# Patient Record
Sex: Female | Born: 1955 | Race: White | Hispanic: No | Marital: Married | Smoking: Never smoker
Health system: Southern US, Community
[De-identification: ages and names within clinical notes are randomized; demographics above are authoritative.]

## PROBLEM LIST (undated history)

## (undated) DIAGNOSIS — R197 Diarrhea, unspecified: Secondary | ICD-10-CM

## (undated) DIAGNOSIS — K8020 Calculus of gallbladder without cholecystitis without obstruction: Secondary | ICD-10-CM

## (undated) DIAGNOSIS — B029 Zoster without complications: Secondary | ICD-10-CM

## (undated) DIAGNOSIS — K429 Umbilical hernia without obstruction or gangrene: Secondary | ICD-10-CM

## (undated) DIAGNOSIS — E785 Hyperlipidemia, unspecified: Secondary | ICD-10-CM

## (undated) DIAGNOSIS — F419 Anxiety disorder, unspecified: Secondary | ICD-10-CM

## (undated) DIAGNOSIS — K9189 Other postprocedural complications and disorders of digestive system: Secondary | ICD-10-CM

## (undated) DIAGNOSIS — G43909 Migraine, unspecified, not intractable, without status migrainosus: Secondary | ICD-10-CM

## (undated) DIAGNOSIS — R9431 Abnormal electrocardiogram [ECG] [EKG]: Secondary | ICD-10-CM

## (undated) DIAGNOSIS — Z9049 Acquired absence of other specified parts of digestive tract: Secondary | ICD-10-CM

## (undated) DIAGNOSIS — J302 Other seasonal allergic rhinitis: Secondary | ICD-10-CM

## (undated) HISTORY — DX: Diarrhea, unspecified: R19.7

## (undated) HISTORY — PX: CATARACT EXTRACTION: SUR2

## (undated) HISTORY — DX: Other postprocedural complications and disorders of digestive system: K91.89

## (undated) HISTORY — DX: Hyperlipidemia, unspecified: E78.5

## (undated) HISTORY — DX: Diarrhea, unspecified: Z90.49

## (undated) HISTORY — DX: Calculus of gallbladder without cholecystitis without obstruction: K80.20

## (undated) HISTORY — DX: Umbilical hernia without obstruction or gangrene: K42.9

## (undated) HISTORY — PX: HERNIA REPAIR: SHX51

## (undated) HISTORY — DX: Anxiety disorder, unspecified: F41.9

## (undated) HISTORY — PX: CHOLECYSTECTOMY: SHX55

## (undated) HISTORY — DX: Abnormal electrocardiogram (ECG) (EKG): R94.31

## (undated) HISTORY — DX: Zoster without complications: B02.9

## (undated) HISTORY — DX: Other seasonal allergic rhinitis: J30.2

---

## 2015-08-28 DIAGNOSIS — H25013 Cortical age-related cataract, bilateral: Secondary | ICD-10-CM | POA: Diagnosis not present

## 2015-08-28 DIAGNOSIS — H25043 Posterior subcapsular polar age-related cataract, bilateral: Secondary | ICD-10-CM | POA: Diagnosis not present

## 2015-08-28 DIAGNOSIS — H2513 Age-related nuclear cataract, bilateral: Secondary | ICD-10-CM | POA: Diagnosis not present

## 2015-08-28 DIAGNOSIS — H25012 Cortical age-related cataract, left eye: Secondary | ICD-10-CM | POA: Diagnosis not present

## 2015-08-31 DIAGNOSIS — H2513 Age-related nuclear cataract, bilateral: Secondary | ICD-10-CM | POA: Diagnosis not present

## 2015-08-31 DIAGNOSIS — H25012 Cortical age-related cataract, left eye: Secondary | ICD-10-CM | POA: Diagnosis not present

## 2015-08-31 DIAGNOSIS — H25013 Cortical age-related cataract, bilateral: Secondary | ICD-10-CM | POA: Diagnosis not present

## 2015-11-15 DIAGNOSIS — E785 Hyperlipidemia, unspecified: Secondary | ICD-10-CM | POA: Insufficient documentation

## 2015-11-15 DIAGNOSIS — E663 Overweight: Secondary | ICD-10-CM | POA: Insufficient documentation

## 2015-11-15 DIAGNOSIS — E559 Vitamin D deficiency, unspecified: Secondary | ICD-10-CM | POA: Insufficient documentation

## 2015-11-15 HISTORY — DX: Vitamin D deficiency, unspecified: E55.9

## 2015-11-15 HISTORY — DX: Overweight: E66.3

## 2016-03-17 DIAGNOSIS — H04123 Dry eye syndrome of bilateral lacrimal glands: Secondary | ICD-10-CM | POA: Diagnosis not present

## 2016-04-02 DIAGNOSIS — B86 Scabies: Secondary | ICD-10-CM | POA: Diagnosis not present

## 2016-04-21 DIAGNOSIS — H538 Other visual disturbances: Secondary | ICD-10-CM | POA: Diagnosis not present

## 2016-10-13 DIAGNOSIS — R109 Unspecified abdominal pain: Secondary | ICD-10-CM | POA: Diagnosis not present

## 2016-10-13 DIAGNOSIS — R1032 Left lower quadrant pain: Secondary | ICD-10-CM | POA: Diagnosis not present

## 2016-10-13 DIAGNOSIS — R1030 Lower abdominal pain, unspecified: Secondary | ICD-10-CM | POA: Diagnosis not present

## 2016-11-27 DIAGNOSIS — H04123 Dry eye syndrome of bilateral lacrimal glands: Secondary | ICD-10-CM | POA: Diagnosis not present

## 2016-12-30 DIAGNOSIS — D485 Neoplasm of uncertain behavior of skin: Secondary | ICD-10-CM | POA: Diagnosis not present

## 2016-12-30 DIAGNOSIS — D2239 Melanocytic nevi of other parts of face: Secondary | ICD-10-CM | POA: Diagnosis not present

## 2016-12-30 DIAGNOSIS — D1801 Hemangioma of skin and subcutaneous tissue: Secondary | ICD-10-CM | POA: Diagnosis not present

## 2016-12-30 DIAGNOSIS — L821 Other seborrheic keratosis: Secondary | ICD-10-CM | POA: Diagnosis not present

## 2016-12-30 DIAGNOSIS — D225 Melanocytic nevi of trunk: Secondary | ICD-10-CM | POA: Diagnosis not present

## 2017-01-01 DIAGNOSIS — K4090 Unilateral inguinal hernia, without obstruction or gangrene, not specified as recurrent: Secondary | ICD-10-CM | POA: Diagnosis not present

## 2017-01-01 DIAGNOSIS — G43909 Migraine, unspecified, not intractable, without status migrainosus: Secondary | ICD-10-CM | POA: Diagnosis not present

## 2017-01-07 DIAGNOSIS — K429 Umbilical hernia without obstruction or gangrene: Secondary | ICD-10-CM

## 2017-01-07 DIAGNOSIS — G43909 Migraine, unspecified, not intractable, without status migrainosus: Secondary | ICD-10-CM | POA: Insufficient documentation

## 2017-01-07 DIAGNOSIS — M5030 Other cervical disc degeneration, unspecified cervical region: Secondary | ICD-10-CM | POA: Insufficient documentation

## 2017-01-07 DIAGNOSIS — N3281 Overactive bladder: Secondary | ICD-10-CM

## 2017-01-07 HISTORY — DX: Overactive bladder: N32.81

## 2017-01-07 HISTORY — DX: Umbilical hernia without obstruction or gangrene: K42.9

## 2017-01-07 HISTORY — DX: Other cervical disc degeneration, unspecified cervical region: M50.30

## 2017-01-10 DIAGNOSIS — K429 Umbilical hernia without obstruction or gangrene: Secondary | ICD-10-CM | POA: Diagnosis not present

## 2017-01-10 DIAGNOSIS — R1013 Epigastric pain: Secondary | ICD-10-CM | POA: Diagnosis not present

## 2017-01-16 DIAGNOSIS — Z79899 Other long term (current) drug therapy: Secondary | ICD-10-CM | POA: Diagnosis not present

## 2017-01-16 DIAGNOSIS — K429 Umbilical hernia without obstruction or gangrene: Secondary | ICD-10-CM | POA: Diagnosis not present

## 2017-01-16 DIAGNOSIS — G43909 Migraine, unspecified, not intractable, without status migrainosus: Secondary | ICD-10-CM | POA: Diagnosis not present

## 2017-02-23 DIAGNOSIS — Z01419 Encounter for gynecological examination (general) (routine) without abnormal findings: Secondary | ICD-10-CM | POA: Diagnosis not present

## 2017-02-23 DIAGNOSIS — Z1231 Encounter for screening mammogram for malignant neoplasm of breast: Secondary | ICD-10-CM | POA: Diagnosis not present

## 2017-03-24 DIAGNOSIS — L820 Inflamed seborrheic keratosis: Secondary | ICD-10-CM | POA: Diagnosis not present

## 2017-04-13 DIAGNOSIS — Z1231 Encounter for screening mammogram for malignant neoplasm of breast: Secondary | ICD-10-CM | POA: Diagnosis not present

## 2017-05-07 DIAGNOSIS — L820 Inflamed seborrheic keratosis: Secondary | ICD-10-CM | POA: Diagnosis not present

## 2017-09-07 DIAGNOSIS — J019 Acute sinusitis, unspecified: Secondary | ICD-10-CM | POA: Diagnosis not present

## 2017-09-07 DIAGNOSIS — G43909 Migraine, unspecified, not intractable, without status migrainosus: Secondary | ICD-10-CM | POA: Diagnosis not present

## 2017-09-17 DIAGNOSIS — L820 Inflamed seborrheic keratosis: Secondary | ICD-10-CM | POA: Diagnosis not present

## 2017-09-25 DIAGNOSIS — J019 Acute sinusitis, unspecified: Secondary | ICD-10-CM | POA: Diagnosis not present

## 2017-10-02 DIAGNOSIS — J0101 Acute recurrent maxillary sinusitis: Secondary | ICD-10-CM | POA: Diagnosis not present

## 2017-11-30 DIAGNOSIS — H04123 Dry eye syndrome of bilateral lacrimal glands: Secondary | ICD-10-CM | POA: Diagnosis not present

## 2018-01-07 DIAGNOSIS — D1801 Hemangioma of skin and subcutaneous tissue: Secondary | ICD-10-CM | POA: Diagnosis not present

## 2018-01-07 DIAGNOSIS — D2239 Melanocytic nevi of other parts of face: Secondary | ICD-10-CM | POA: Diagnosis not present

## 2018-01-07 DIAGNOSIS — D225 Melanocytic nevi of trunk: Secondary | ICD-10-CM | POA: Diagnosis not present

## 2018-01-07 DIAGNOSIS — D485 Neoplasm of uncertain behavior of skin: Secondary | ICD-10-CM | POA: Diagnosis not present

## 2018-01-07 DIAGNOSIS — L821 Other seborrheic keratosis: Secondary | ICD-10-CM | POA: Diagnosis not present

## 2018-04-21 DIAGNOSIS — Z6825 Body mass index (BMI) 25.0-25.9, adult: Secondary | ICD-10-CM | POA: Diagnosis not present

## 2018-04-21 DIAGNOSIS — J0100 Acute maxillary sinusitis, unspecified: Secondary | ICD-10-CM | POA: Diagnosis not present

## 2018-05-24 DIAGNOSIS — S058X2A Other injuries of left eye and orbit, initial encounter: Secondary | ICD-10-CM | POA: Diagnosis not present

## 2018-06-24 DIAGNOSIS — H43813 Vitreous degeneration, bilateral: Secondary | ICD-10-CM | POA: Diagnosis not present

## 2018-07-01 ENCOUNTER — Other Ambulatory Visit: Payer: Self-pay | Admitting: Gastroenterology

## 2018-07-01 DIAGNOSIS — R1011 Right upper quadrant pain: Secondary | ICD-10-CM | POA: Diagnosis not present

## 2018-07-05 ENCOUNTER — Inpatient Hospital Stay: Admission: RE | Admit: 2018-07-05 | Payer: Self-pay | Source: Ambulatory Visit

## 2018-07-05 DIAGNOSIS — K8020 Calculus of gallbladder without cholecystitis without obstruction: Secondary | ICD-10-CM | POA: Diagnosis not present

## 2018-07-05 DIAGNOSIS — K8080 Other cholelithiasis without obstruction: Secondary | ICD-10-CM | POA: Diagnosis not present

## 2018-07-05 DIAGNOSIS — R1011 Right upper quadrant pain: Secondary | ICD-10-CM | POA: Diagnosis not present

## 2018-07-09 ENCOUNTER — Emergency Department (HOSPITAL_COMMUNITY): Payer: BLUE CROSS/BLUE SHIELD

## 2018-07-09 ENCOUNTER — Encounter: Payer: Self-pay | Admitting: Emergency Medicine

## 2018-07-09 ENCOUNTER — Emergency Department (HOSPITAL_COMMUNITY)
Admission: EM | Admit: 2018-07-09 | Discharge: 2018-07-09 | Disposition: A | Discharge status: Home / Self Care | Payer: BLUE CROSS/BLUE SHIELD | Attending: Emergency Medicine | Admitting: Emergency Medicine

## 2018-07-09 DIAGNOSIS — R1013 Epigastric pain: Secondary | ICD-10-CM | POA: Diagnosis not present

## 2018-07-09 DIAGNOSIS — K8020 Calculus of gallbladder without cholecystitis without obstruction: Secondary | ICD-10-CM | POA: Diagnosis not present

## 2018-07-09 HISTORY — DX: Migraine, unspecified, not intractable, without status migrainosus: G43.909

## 2018-07-09 LAB — COMPREHENSIVE METABOLIC PANEL
ALT: 22 U/L (ref 0–44)
AST: 29 U/L (ref 15–41)
Albumin: 4.2 g/dL (ref 3.5–5.0)
Alkaline Phosphatase: 52 U/L (ref 38–126)
Anion gap: 12 (ref 5–15)
BUN: 15 mg/dL (ref 8–23)
CO2: 23 mmol/L (ref 22–32)
Calcium: 9.5 mg/dL (ref 8.9–10.3)
Chloride: 102 mmol/L (ref 98–111)
Creatinine, Ser: 0.86 mg/dL (ref 0.44–1.00)
GFR calc Af Amer: >60 mL/min (ref >60)
GFR calc non Af Amer: >60 mL/min (ref >60)
Glucose, Bld: 91 mg/dL (ref 70–99)
Potassium: 3.6 mmol/L (ref 3.5–5.1)
Sodium: 137 mmol/L (ref 135–145)
Total Bilirubin: 1 mg/dL (ref 0.3–1.2)
Total Protein: 6.9 g/dL (ref 6.5–8.1)

## 2018-07-09 LAB — URINALYSIS, ROUTINE W REFLEX MICROSCOPIC
Bilirubin Urine: NEGATIVE
GLUCOSE, UA: NEGATIVE mg/dL
Hgb urine dipstick: NEGATIVE
Ketones, ur: 80 mg/dL — AB
Leukocytes,Ua: NEGATIVE
Nitrite: NEGATIVE
Protein, ur: NEGATIVE mg/dL
Specific Gravity, Urine: 1.026 (ref 1.005–1.030)
pH: 5 (ref 5.0–8.0)

## 2018-07-09 LAB — CBC
HCT: 43.4 % (ref 36.0–46.0)
Hemoglobin: 14.9 g/dL (ref 12.0–15.0)
MCH: 29.9 pg (ref 26.0–34.0)
MCHC: 34.3 g/dL (ref 30.0–36.0)
MCV: 87.1 fL (ref 80.0–100.0)
PLATELETS: 244 10*3/uL (ref 150–400)
RBC: 4.98 MIL/uL (ref 3.87–5.11)
RDW: 12.8 % (ref 11.5–15.5)
WBC: 5.1 10*3/uL (ref 4.0–10.5)
nRBC: 0 % (ref 0.0–0.2)

## 2018-07-09 LAB — LIPASE, BLOOD: Lipase: 51 U/L (ref 11–51)

## 2018-07-09 MED ORDER — IOHEXOL 300 MG/ML  SOLN
100.0000 mL | Freq: Once | INTRAMUSCULAR | Status: AC | PRN
  Administered 2018-07-09: 100 mL via INTRAVENOUS

## 2018-07-09 MED ORDER — OMEPRAZOLE 20 MG PO CPDR: 20.0000 mg | DELAYED_RELEASE_CAPSULE | Freq: Two times a day (BID) | ORAL | 0 refills | Status: DC

## 2018-07-09 MED ORDER — ONDANSETRON HCL 4 MG/2ML IJ SOLN
4.0000 mg | Freq: Once | INTRAMUSCULAR | Status: DC
  Filled 2018-07-09: qty 2

## 2018-07-09 MED ORDER — SODIUM CHLORIDE 0.9% FLUSH
3.0000 mL | Freq: Once | INTRAVENOUS | Status: AC
  Administered 2018-07-09: 3 mL via INTRAVENOUS

## 2018-07-09 MED ORDER — MORPHINE SULFATE (PF) 4 MG/ML IV SOLN
4.0000 mg | Freq: Once | INTRAVENOUS | Status: DC
  Filled 2018-07-09: qty 1

## 2018-07-09 MED ORDER — SODIUM CHLORIDE 0.9 % IV BOLUS
1000.0000 mL | Freq: Once | INTRAVENOUS | Status: AC
  Administered 2018-07-09: 1000 mL via INTRAVENOUS

## 2018-07-09 NOTE — ED Notes (Signed)
Patient transported to Ultrasound 

## 2018-07-09 NOTE — ED Provider Notes (Signed)
[facility] EMERGENCY DEPARTMENT Provider Note   CSN: [id number] Arrival date & time: 07/09/18  1441    History   Chief Complaint Chief Complaint  Patient presents with  . Abdominal Pain    HPI Ashley Harper is a 63 y.o. female.     The history is provided by the patient and medical records. No language interpreter was used.  Abdominal Pain     63 year old female with history of cholelithiasis presenting complaining of abdominal pain.  Patient report for the past month she has had recurrent upper abdominal pain.  Pain is postprandial, described as a crampy sensation across the abdomen radiates to her back and her shoulder.  Eating makes the pain worse.  She was seen by GI specialist and had an ultrasound of her gallbladder 5 days ago show evidence of cholelithiasis.  She reported having elevated lipase of 87 and was told that she has pancreatitis.  Her pain still persists, she is eating less, and currently losing weight from not eating.  She does not complain of any fever or chills, no chest pain shortness of breath productive cough dysuria bowel bladder changes.  She did call surgery today in regards to her pain and was told to come to the ER for further evaluation.  She rates the pain is moderate in severity at this time.  She denies history of diabetes, or alcohol abuse.  Past Medical History:  Diagnosis Date  . Migraine     There are no active problems to display for this patient.   Past Surgical History:  Procedure Laterality Date  . HERNIA REPAIR       OB History   No obstetric history on file.      Home Medications    Prior to Admission medications   Not on File    Family History No family history on file.  Social History Social History   Tobacco Use  . Smoking status: Not on file  Substance Use Topics  . Alcohol use: Not on file  . Drug use: Not on file     Allergies   Patient has no known allergies.   Review of  Systems Review of Systems  Gastrointestinal: Positive for abdominal pain.  All other systems reviewed and are negative.    Physical Exam Updated Vital Signs BP (!) 156/80 (BP Location: Right Arm)   Pulse 86   Temp 98.8 F (37.1 C) (Oral)   Resp 16   SpO2 98%   Physical Exam Vitals signs and nursing note reviewed.  Constitutional:      General: She is not in acute distress.    Appearance: She is well-developed.  HENT:     Head: Atraumatic.  Eyes:     Conjunctiva/sclera: Conjunctivae normal.  Neck:     Musculoskeletal: Neck supple.  Cardiovascular:     Rate and Rhythm: Normal rate and regular rhythm.  Pulmonary:     Effort: Pulmonary effort is normal.     Breath sounds: Normal breath sounds.  Abdominal:     General: Abdomen is flat.     Palpations: Abdomen is soft.     Tenderness: There is abdominal tenderness in the right upper quadrant, epigastric area and left upper quadrant. Positive signs include Murphy's sign. Negative signs include McBurney's sign.     Hernia: No hernia is present.  Skin:    Findings: No rash.  Neurological:     Mental Status: She is alert.      ED Treatments /  Results  Labs (all labs ordered are listed, but only abnormal results are displayed) Labs Reviewed  URINALYSIS, ROUTINE W REFLEX MICROSCOPIC - Abnormal; Notable for the following components:      Result Value   Ketones, ur 80 (*)    All other components within normal limits  LIPASE, BLOOD  COMPREHENSIVE METABOLIC PANEL  CBC    EKG None  Radiology Ct Abdomen Pelvis W Contrast  Result Date: 07/09/2018 CLINICAL DATA:  63 y/o F; 1 month of right upper quadrant abdominal pain. EXAM: CT ABDOMEN AND PELVIS WITH CONTRAST TECHNIQUE: Multidetector CT imaging of the abdomen and pelvis was performed using the standard protocol following bolus administration of intravenous contrast. CONTRAST:  OMNIPAQUE IOHEXOL 300 MG/ML  SOLN COMPARISON:  07/09/2018 abdominal ultrasound. FINDINGS:  Lower chest: No acute abnormality. Hepatobiliary: No focal liver abnormality is seen. Radiolucent cholelithiasis. No gallbladder wall thickening or biliary ductal dilatation. Pancreas: Unremarkable. No pancreatic ductal dilatation or surrounding inflammatory changes. Spleen: Normal in size without focal abnormality. Adrenals/Urinary Tract: Adrenal glands are unremarkable. Right kidney upper and lower pole cyst measuring up to 11 mm. Otherwise kidneys are normal, without renal calculi, focal lesion, or hydronephrosis. Bladder is unremarkable. Stomach/Bowel: Stomach is within normal limits. Appendix appears normal. No evidence of bowel wall thickening, distention, or inflammatory changes. Vascular/Lymphatic: Aortic atherosclerosis. No enlarged abdominal or pelvic lymph nodes. Reproductive: Uterus and bilateral adnexa are unremarkable. Other: No abdominal wall hernia or abnormality. No abdominopelvic ascites. Musculoskeletal: No fracture is seen. IMPRESSION: 1. No acute process identified. 2. Cholelithiasis. Aortic Atherosclerosis (ICD10-I70.0). Electronically Signed   By: Mitzi Hansen M.D.   On: 07/09/2018 21:54   US Abdomen Limited  Result Date: 07/09/2018 CLINICAL DATA:  Right upper quadrant pain EXAM: ULTRASOUND ABDOMEN LIMITED RIGHT UPPER QUADRANT COMPARISON:  None. FINDINGS: Gallbladder: 1.5 cm gallstone, mobile. No wall thickening or sonographic Murphy's sign. Common bile duct: Diameter: Normal caliber, 4 mm Liver: No focal lesion identified. Within normal limits in parenchymal echogenicity. Portal vein is patent on color Doppler imaging with normal direction of blood flow towards the liver. IMPRESSION: Cholelithiasis.  No sonographic evidence of acute cholecystitis. Electronically Signed   By: Charlett Nose M.D.   On: 07/09/2018 20:06    Procedures Procedures (including critical care time)  Medications Ordered in ED Medications  morphine 4 MG/ML injection 4 mg (4 mg Intravenous Refused  07/09/18 1912)  ondansetron (ZOFRAN) injection 4 mg (4 mg Intravenous Refused 07/09/18 1912)  sodium chloride flush (NS) 0.9 % injection 3 mL (3 mLs Intravenous Given 07/09/18 2033)  sodium chloride 0.9 % bolus 1,000 mL (1,000 mLs Intravenous New Bag/Given 07/09/18 2032)  iohexol (OMNIPAQUE) 300 MG/ML solution 100 mL (100 mLs Intravenous Contrast Given 07/09/18 2124)     Initial Impression / Assessment and Plan / ED Course  I have reviewed the triage vital signs and the nursing notes.  Pertinent labs & imaging results that were available during my care of the patient were reviewed by me and considered in my medical decision making (see chart for details).        BP 138/87   Pulse 76   Temp 98.8 F (37.1 C) (Oral)   Resp 16   SpO2 98%    Final Clinical Impressions(s) / ED Diagnoses   Final diagnoses:  Epigastric pain    ED Discharge Orders         Ordered    omeprazole (PRILOSEC) 20 MG capsule  2 times daily before meals     07/09/18  2224         6:29 PM Patient diagnosed with cholelithiasis 5 days ago here with recurrent upper abdominal pain.  She brought with her the ultrasound results showing a 1.7 cm gallstone without any signs of cholecystitis or dilatation of the common bile duct.  She does have tenderness on her upper abdomen during examination.  She would benefit from a repeat abdominal ultrasound.  Initially her labs remarkable for 80 ketones in urine, IV fluid given along with pain medication and antinausea medication.  She has normal lipase, normal electrolytes panel, normal H&H and normal WBC.  8:14 PM Repeat abdominal ultrasound demonstrating 1.5 cm gallstone that is mobile but no sonographic evidence of acute cholecystitis.  CBD within normal caliber.  Patient still voiced concern about her upper abdominal pain and would like to have a CT scan for further evaluation.  States that she is going on vacation tomorrow and would like to make sure she does not have any  acute life-threatening problem.  We will obtain abdominal pelvis CT scan for further care.  Care discussed with Dr. Charm Barges.  10:16 PM Abd/pelvis CT without acute changes.  Pt reassured.  Recommend eating bland food and f/u with GI specialist for further care.  prilosec prescribed.  Return precaution given.    Fayrene Helper, PA-C 07/09/18 2225    Terrilee Files, MD 07/10/18 [phone]

## 2018-07-09 NOTE — ED Triage Notes (Addendum)
Pt arrives to ED with upper abd pain that radiates into her back and shoulder- Pt had [place] that showed a gallstone and was told her lipase was evaluated. Pt here for a possible surgical consult.  Pt reports she has had a decreased appetite and even drinking water causing increase in pain.

## 2018-08-12 DIAGNOSIS — J0100 Acute maxillary sinusitis, unspecified: Secondary | ICD-10-CM | POA: Diagnosis not present

## 2018-10-12 DIAGNOSIS — Z9109 Other allergy status, other than to drugs and biological substances: Secondary | ICD-10-CM | POA: Diagnosis not present

## 2018-10-12 DIAGNOSIS — H6981 Other specified disorders of Eustachian tube, right ear: Secondary | ICD-10-CM | POA: Diagnosis not present

## 2018-10-12 DIAGNOSIS — Z6824 Body mass index (BMI) 24.0-24.9, adult: Secondary | ICD-10-CM | POA: Diagnosis not present

## 2019-02-02 DIAGNOSIS — Z01419 Encounter for gynecological examination (general) (routine) without abnormal findings: Secondary | ICD-10-CM | POA: Diagnosis not present

## 2019-02-02 DIAGNOSIS — Z1239 Encounter for other screening for malignant neoplasm of breast: Secondary | ICD-10-CM | POA: Diagnosis not present

## 2019-02-07 DIAGNOSIS — H04123 Dry eye syndrome of bilateral lacrimal glands: Secondary | ICD-10-CM | POA: Diagnosis not present

## 2019-03-07 IMAGING — CT CT ABD-PELV W/ CM
2 of 5 series · 16 of 46 positions shown, 18 images · IV contrast (Omni 300)
Comparison: 07/09/2018 abdominal ultrasound.

CLINICAL DATA: 62 y/o F; 1 month of right upper quadrant abdominal
pain.

EXAM:
CT ABDOMEN AND PELVIS WITH CONTRAST
TECHNIQUE: Multidetector CT imaging of the abdomen and pelvis was performed
using the standard protocol following bolus administration of
intravenous contrast.
CONTRAST:  100mL OMNIPAQUE IOHEXOL 300 MG/ML  SOLN

[Series 3: a/p w/ 5mm · axial · 0.74mm/px · z∈[-436,-36]mm · 13 of 90 slices shown, 15 images]
[im 5/90  soft-tissue]
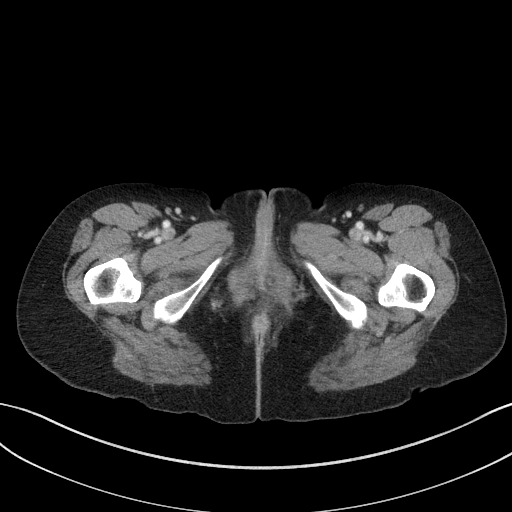
[im 5/90  bone]
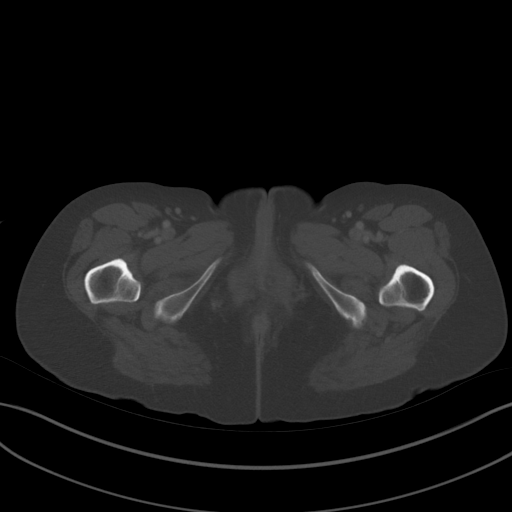
[im 10/90  soft-tissue]
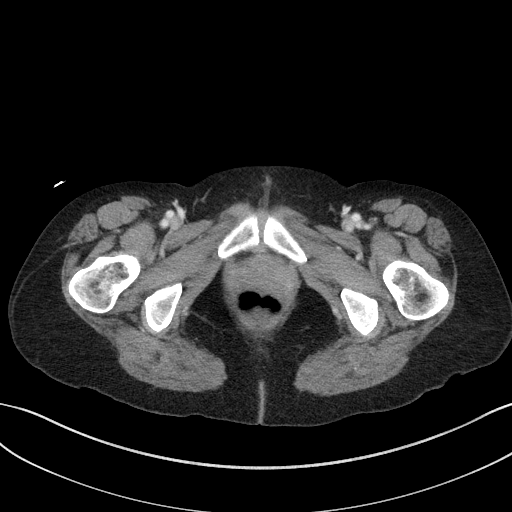
[im 20/90  soft-tissue]
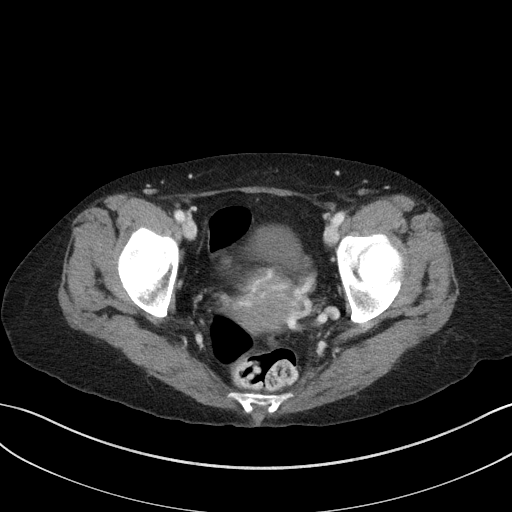
[im 25/90  soft-tissue]
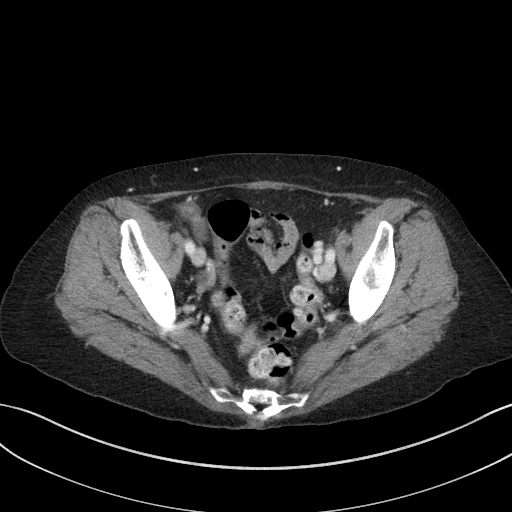
[im 30/90  soft-tissue]
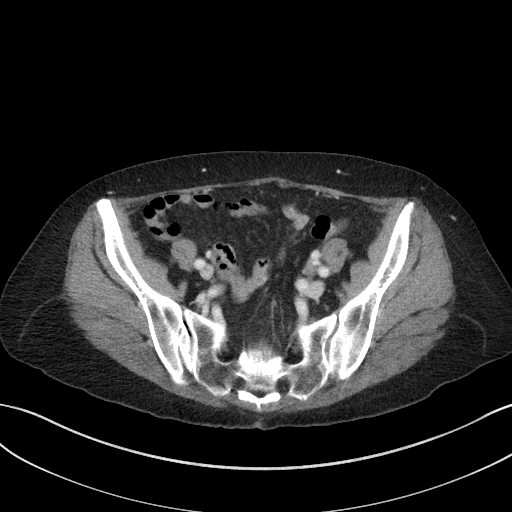
[im 40/90  soft-tissue]
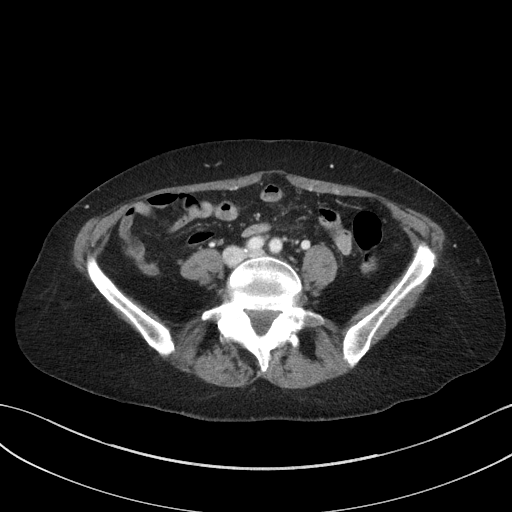
[im 45/90  soft-tissue]
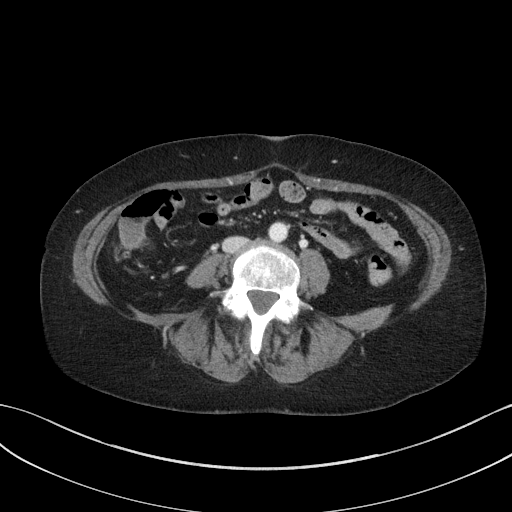
[im 50/90  soft-tissue]
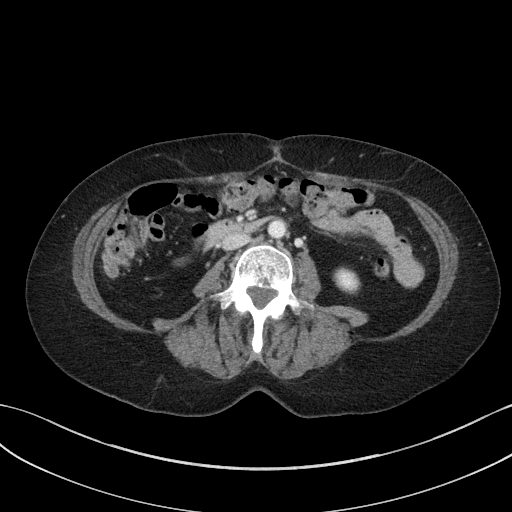
[im 60/90  soft-tissue]
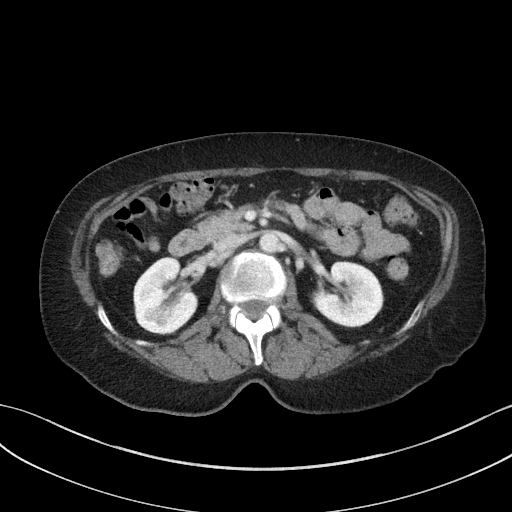
[im 60/90  bone]
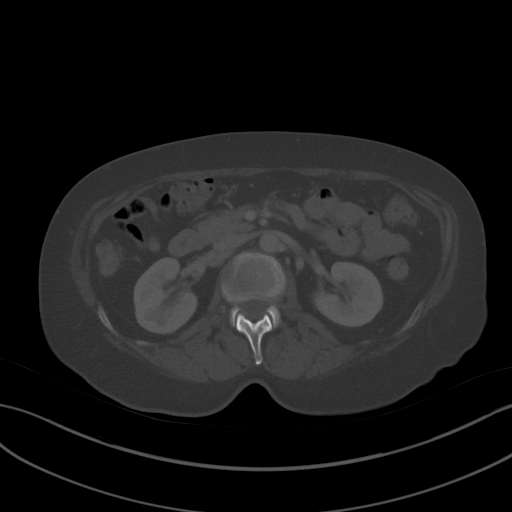
[im 65/90  soft-tissue]
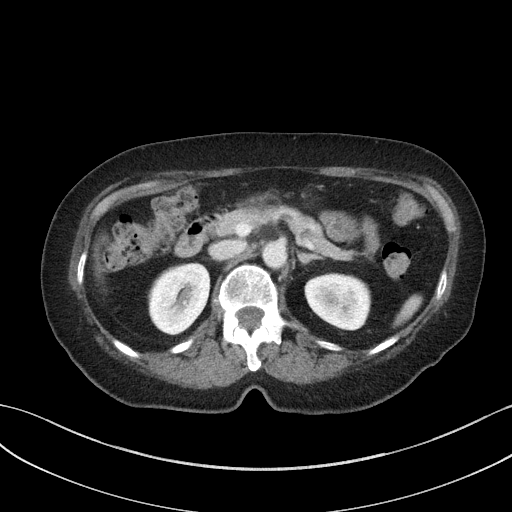
[im 70/90  soft-tissue]
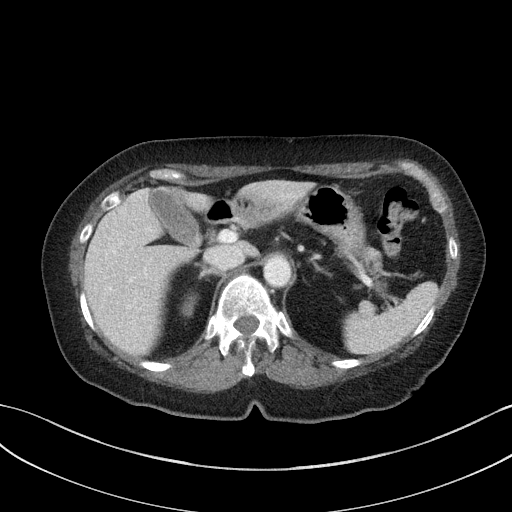
[im 80/90  soft-tissue]
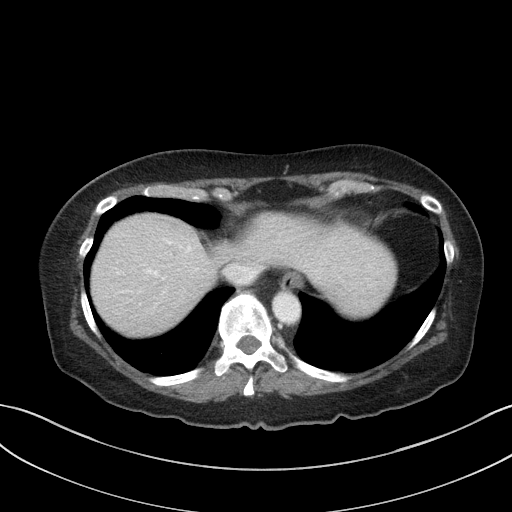
[im 85/90  soft-tissue]
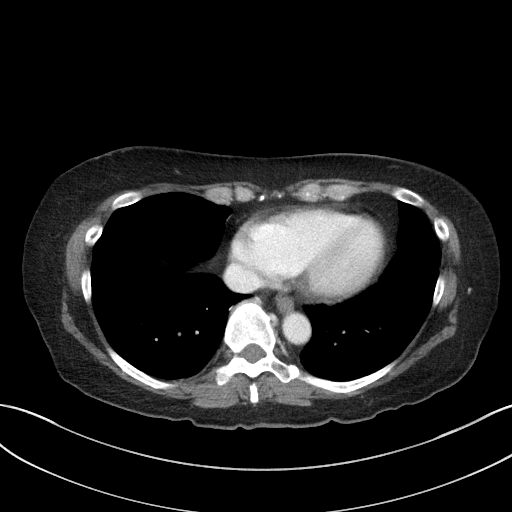

[Series 6: a/p w/ cor · coronal · 0.65mm/px · 3 of 104 slices shown]
[im 35/104  soft-tissue]
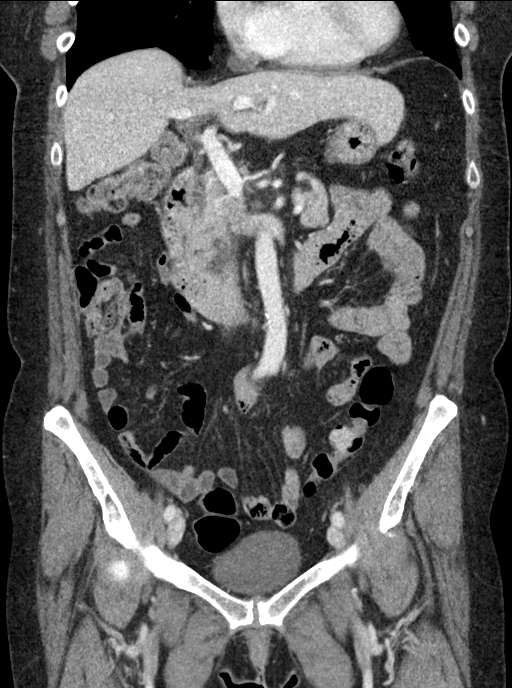
[im 46/104  soft-tissue]
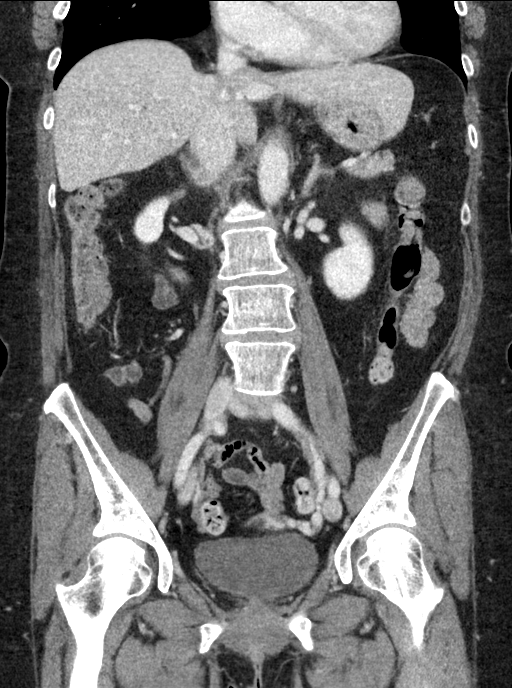
[im 58/104  soft-tissue]
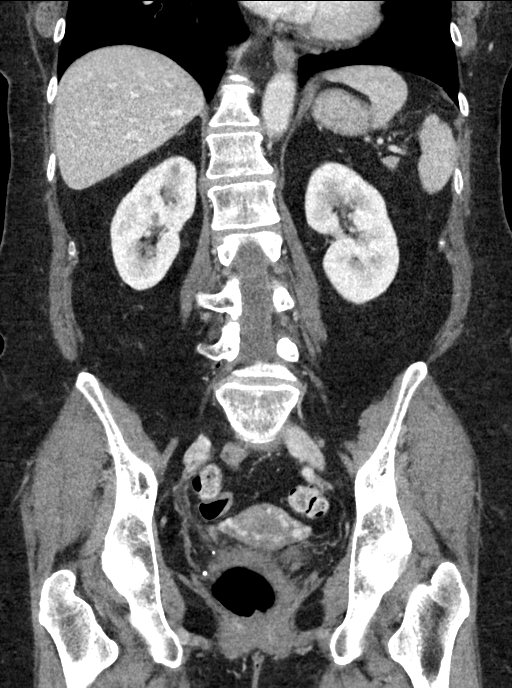

[16 of 46 positions shown; findings below may reference images not displayed]

FINDINGS: Lower chest: No acute abnormality.

Hepatobiliary: No focal liver abnormality is seen. Radiolucent
cholelithiasis. No gallbladder wall thickening or biliary ductal
dilatation.

Pancreas: Unremarkable. No pancreatic ductal dilatation or
surrounding inflammatory changes.

Spleen: Normal in size without focal abnormality.

Adrenals/Urinary Tract: Adrenal glands are unremarkable. Right
kidney upper and lower pole cyst measuring up to 11 mm. Otherwise
kidneys are normal, without renal calculi, focal lesion, or
hydronephrosis. Bladder is unremarkable.

Stomach/Bowel: Stomach is within normal limits. Appendix appears
normal. No evidence of bowel wall thickening, distention, or
inflammatory changes.

Vascular/Lymphatic: Aortic atherosclerosis. No enlarged abdominal or
pelvic lymph nodes.

Reproductive: Uterus and bilateral adnexa are unremarkable.

Other: No abdominal wall hernia or abnormality. No abdominopelvic
ascites.

Musculoskeletal: No fracture is seen.
IMPRESSION: 1. No acute process identified.
2. Cholelithiasis.

Aortic Atherosclerosis ([id number]).

## 2019-03-07 IMAGING — US US ABDOMEN LIMITED
1 series · 14 of 25 positions shown · non-contrast
Comparison: None.

CLINICAL DATA: Right upper quadrant pain

EXAM:
ULTRASOUND ABDOMEN LIMITED RIGHT UPPER QUADRANT

[Series 1: us abdomen limited · 14 of 53 slices shown]
[im 1/53]
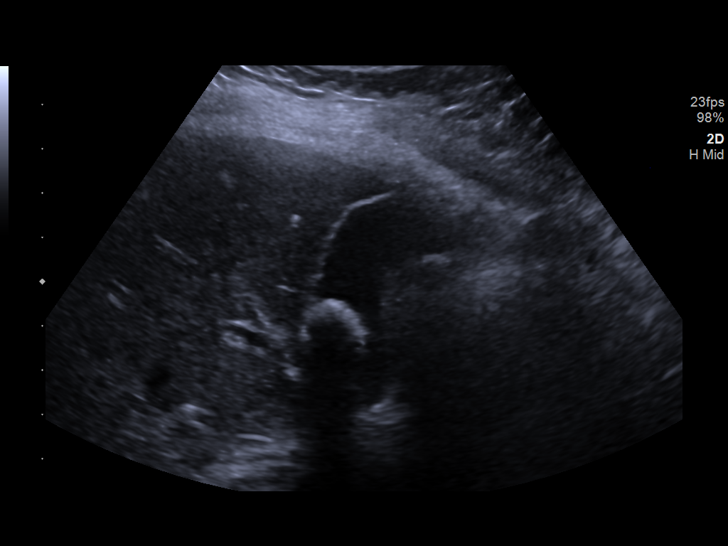
[im 5/53]
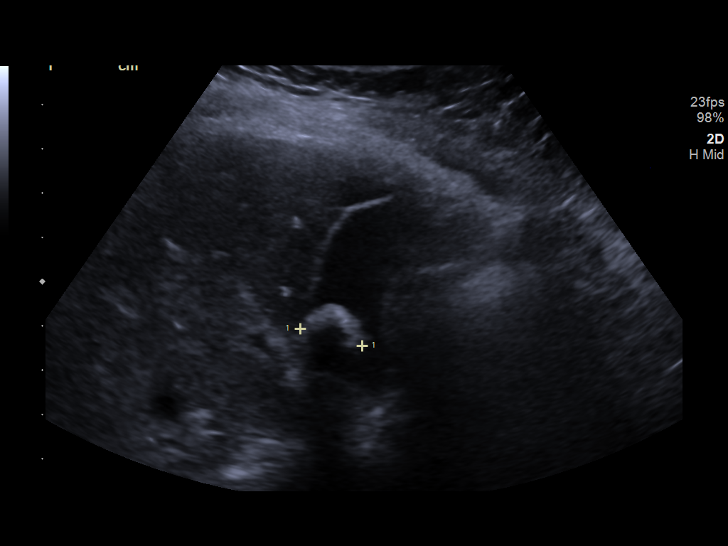
[im 9/53]
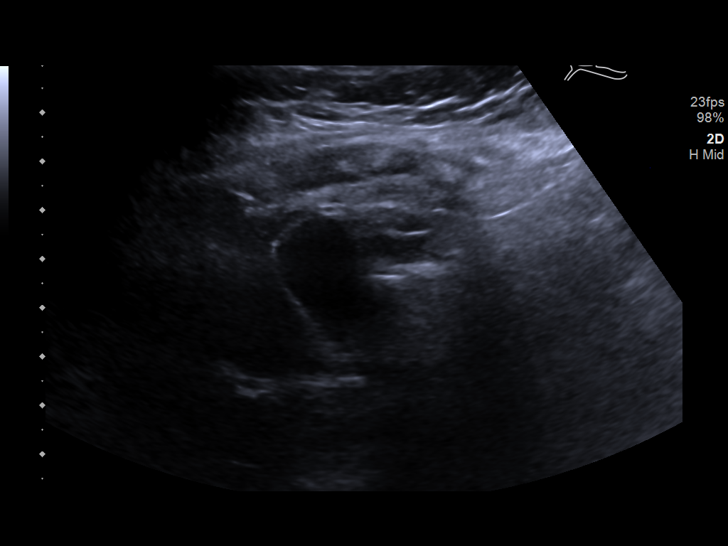
[im 14/53]
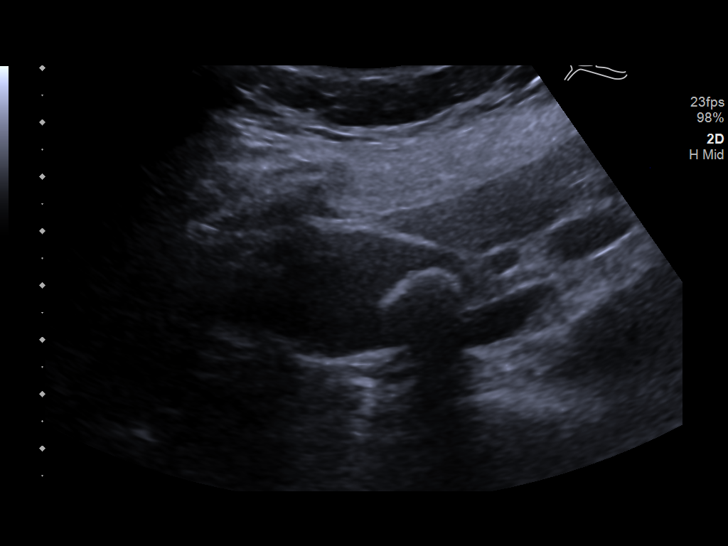
[im 18/53]
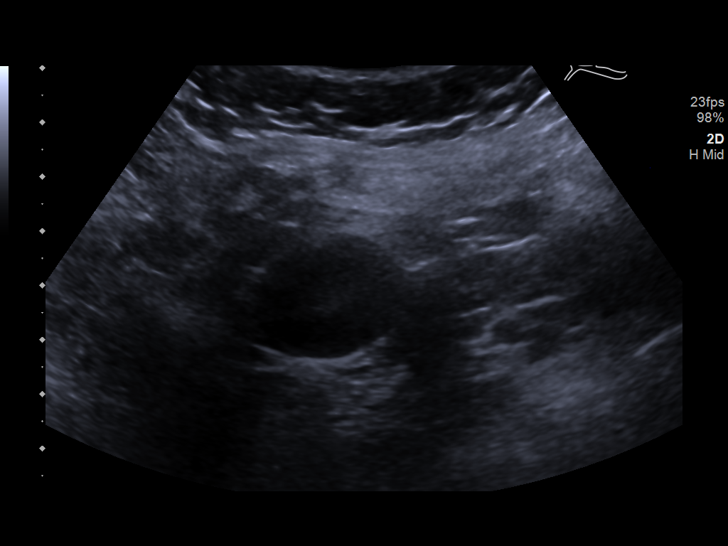
[im 20/53]
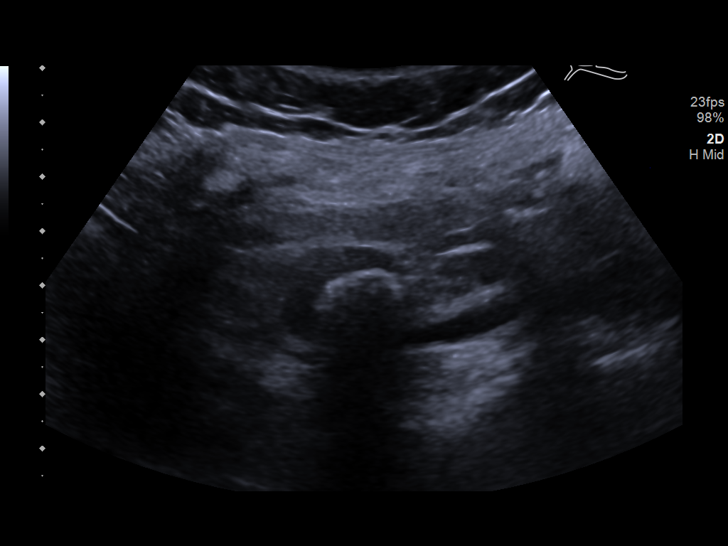
[im 24/53]
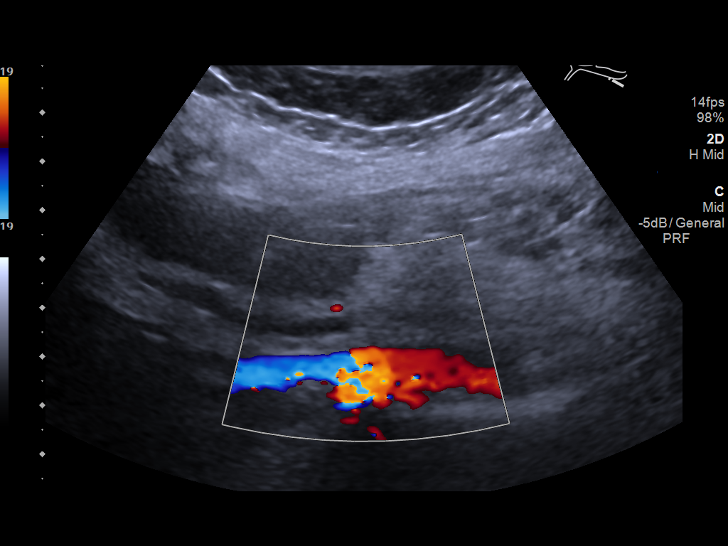
[im 29/53]
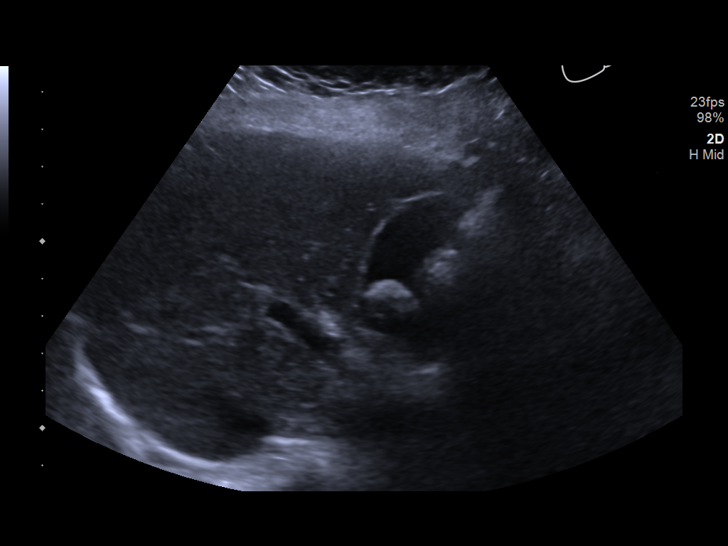
[im 33/53]
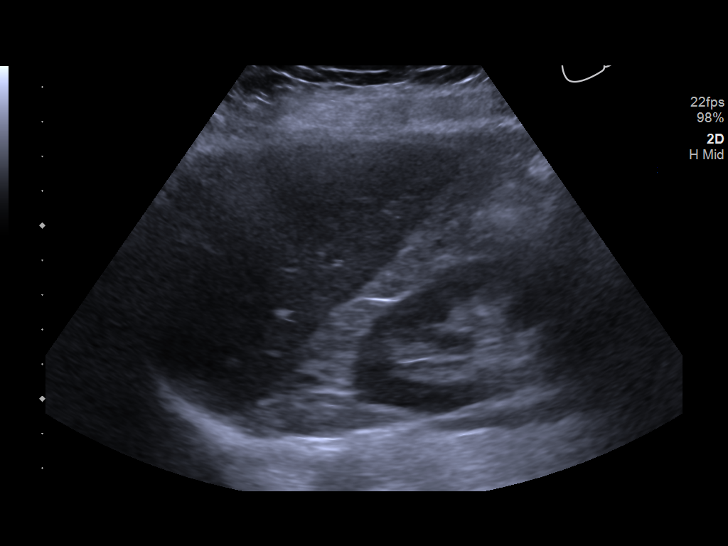
[im 35/53]
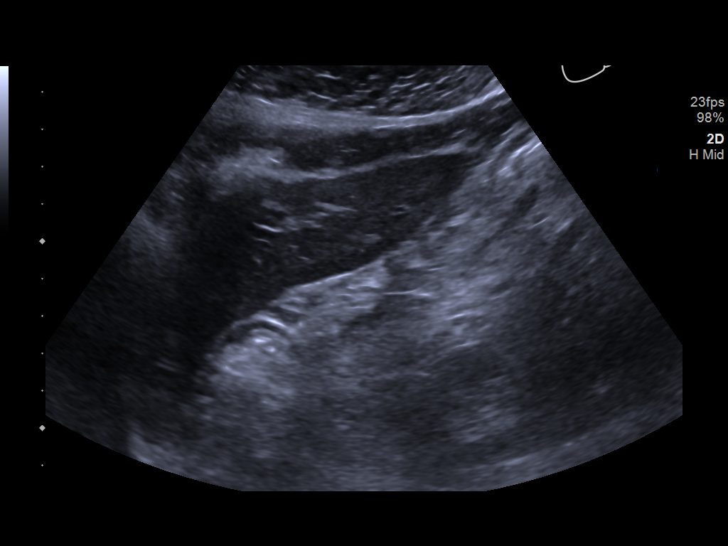
[im 40/53]
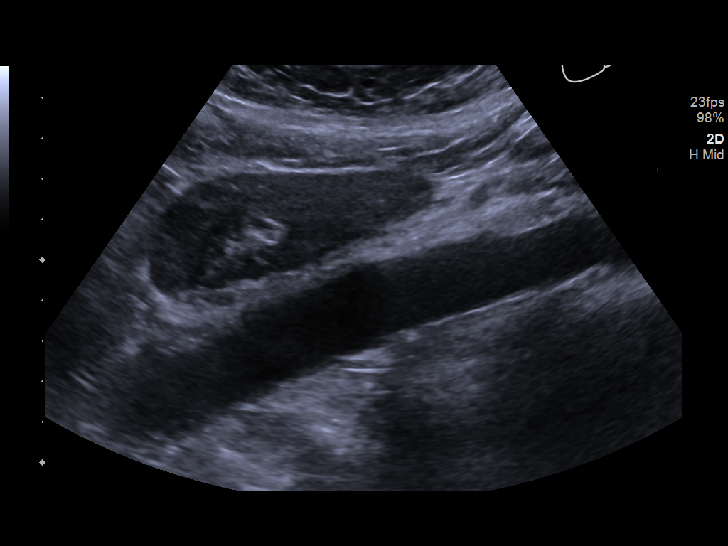
[im 44/53]
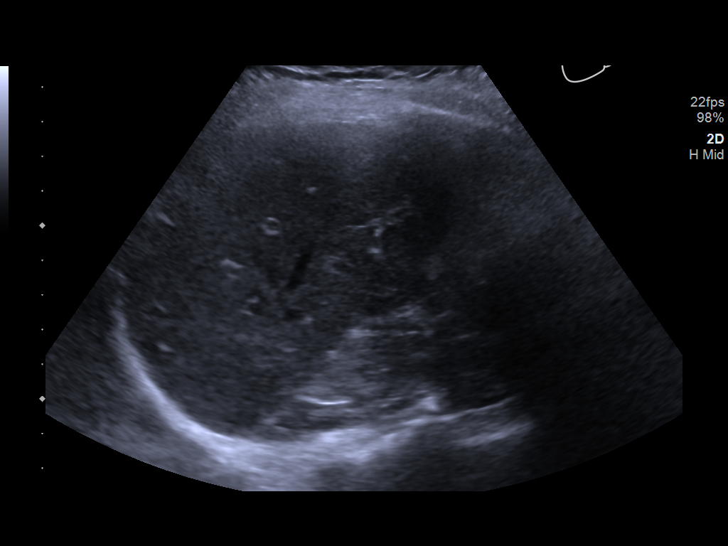
[im 48/53]
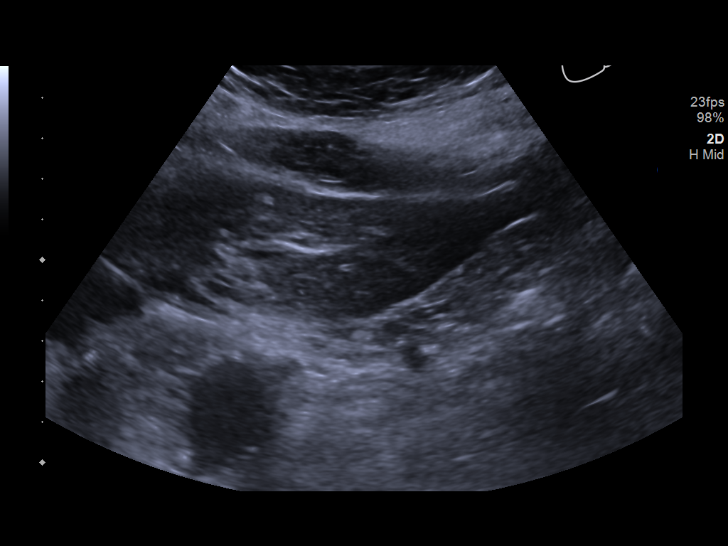
[im 53/53]
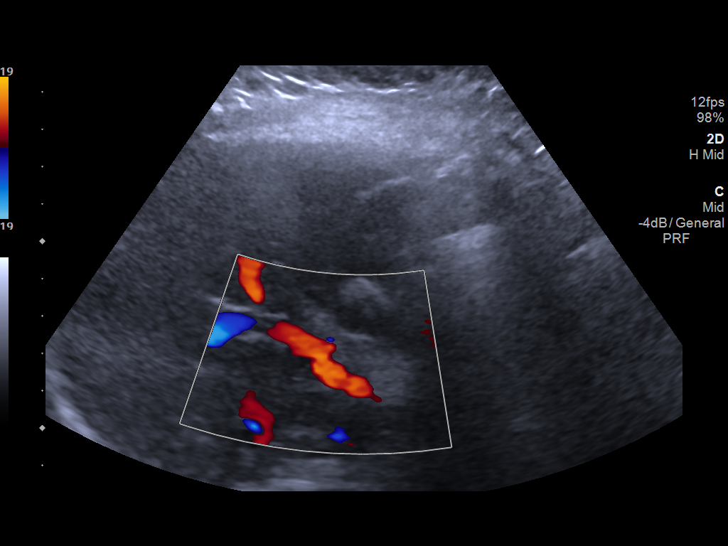

[14 of 25 positions shown; findings below may reference images not displayed]

FINDINGS: Gallbladder:

1.5 cm gallstone, mobile. No wall thickening or sonographic Murphy's
sign.

Common bile duct:

Diameter: Normal caliber, 4 mm

Liver:

No focal lesion identified. Within normal limits in parenchymal
echogenicity. Portal vein is patent on color Doppler imaging with
normal direction of blood flow towards the liver.
IMPRESSION: Cholelithiasis.  No sonographic evidence of acute cholecystitis.

## 2019-04-19 DIAGNOSIS — D1801 Hemangioma of skin and subcutaneous tissue: Secondary | ICD-10-CM | POA: Diagnosis not present

## 2019-04-19 DIAGNOSIS — L821 Other seborrheic keratosis: Secondary | ICD-10-CM | POA: Diagnosis not present

## 2019-04-19 DIAGNOSIS — L728 Other follicular cysts of the skin and subcutaneous tissue: Secondary | ICD-10-CM | POA: Diagnosis not present

## 2019-04-19 DIAGNOSIS — L820 Inflamed seborrheic keratosis: Secondary | ICD-10-CM | POA: Diagnosis not present

## 2019-04-19 DIAGNOSIS — D225 Melanocytic nevi of trunk: Secondary | ICD-10-CM | POA: Diagnosis not present

## 2019-04-21 DIAGNOSIS — R110 Nausea: Secondary | ICD-10-CM | POA: Diagnosis not present

## 2019-04-21 DIAGNOSIS — K8020 Calculus of gallbladder without cholecystitis without obstruction: Secondary | ICD-10-CM | POA: Diagnosis not present

## 2019-04-21 DIAGNOSIS — R1013 Epigastric pain: Secondary | ICD-10-CM | POA: Diagnosis not present

## 2019-04-21 DIAGNOSIS — K5904 Chronic idiopathic constipation: Secondary | ICD-10-CM | POA: Diagnosis not present

## 2019-04-27 DIAGNOSIS — K8020 Calculus of gallbladder without cholecystitis without obstruction: Secondary | ICD-10-CM | POA: Diagnosis not present

## 2019-05-24 DIAGNOSIS — Z1331 Encounter for screening for depression: Secondary | ICD-10-CM | POA: Diagnosis not present

## 2019-05-24 DIAGNOSIS — R110 Nausea: Secondary | ICD-10-CM | POA: Diagnosis not present

## 2019-05-24 DIAGNOSIS — Z0000 Encounter for general adult medical examination without abnormal findings: Secondary | ICD-10-CM | POA: Diagnosis not present

## 2019-05-24 DIAGNOSIS — Z23 Encounter for immunization: Secondary | ICD-10-CM | POA: Diagnosis not present

## 2019-05-24 DIAGNOSIS — E785 Hyperlipidemia, unspecified: Secondary | ICD-10-CM | POA: Diagnosis not present

## 2019-05-24 DIAGNOSIS — Z6825 Body mass index (BMI) 25.0-25.9, adult: Secondary | ICD-10-CM | POA: Diagnosis not present

## 2019-05-24 DIAGNOSIS — Z9109 Other allergy status, other than to drugs and biological substances: Secondary | ICD-10-CM | POA: Diagnosis not present

## 2019-05-24 DIAGNOSIS — R143 Flatulence: Secondary | ICD-10-CM | POA: Diagnosis not present

## 2019-06-10 DIAGNOSIS — Z01818 Encounter for other preprocedural examination: Secondary | ICD-10-CM | POA: Diagnosis not present

## 2019-06-14 DIAGNOSIS — K8010 Calculus of gallbladder with chronic cholecystitis without obstruction: Secondary | ICD-10-CM | POA: Diagnosis not present

## 2019-06-30 DIAGNOSIS — R197 Diarrhea, unspecified: Secondary | ICD-10-CM | POA: Diagnosis not present

## 2019-11-07 DIAGNOSIS — H33103 Unspecified retinoschisis, bilateral: Secondary | ICD-10-CM | POA: Diagnosis not present

## 2019-12-13 DIAGNOSIS — I781 Nevus, non-neoplastic: Secondary | ICD-10-CM | POA: Diagnosis not present

## 2019-12-13 DIAGNOSIS — L209 Atopic dermatitis, unspecified: Secondary | ICD-10-CM | POA: Diagnosis not present

## 2019-12-13 DIAGNOSIS — L821 Other seborrheic keratosis: Secondary | ICD-10-CM | POA: Diagnosis not present

## 2020-01-04 DIAGNOSIS — K591 Functional diarrhea: Secondary | ICD-10-CM | POA: Diagnosis not present

## 2020-05-30 ENCOUNTER — Other Ambulatory Visit: Payer: Self-pay

## 2020-05-30 DIAGNOSIS — B029 Zoster without complications: Secondary | ICD-10-CM | POA: Insufficient documentation

## 2020-05-30 DIAGNOSIS — R197 Diarrhea, unspecified: Secondary | ICD-10-CM | POA: Insufficient documentation

## 2020-05-30 DIAGNOSIS — F419 Anxiety disorder, unspecified: Secondary | ICD-10-CM | POA: Insufficient documentation

## 2020-05-30 DIAGNOSIS — R9431 Abnormal electrocardiogram [ECG] [EKG]: Secondary | ICD-10-CM | POA: Insufficient documentation

## 2020-05-30 DIAGNOSIS — J302 Other seasonal allergic rhinitis: Secondary | ICD-10-CM | POA: Insufficient documentation

## 2020-05-30 DIAGNOSIS — Z9049 Acquired absence of other specified parts of digestive tract: Secondary | ICD-10-CM | POA: Insufficient documentation

## 2020-05-30 DIAGNOSIS — K8020 Calculus of gallbladder without cholecystitis without obstruction: Secondary | ICD-10-CM | POA: Insufficient documentation

## 2020-05-31 ENCOUNTER — Encounter: Payer: Self-pay | Admitting: Cardiology

## 2020-05-31 ENCOUNTER — Ambulatory Visit (INDEPENDENT_AMBULATORY_CARE_PROVIDER_SITE_OTHER): Payer: Medicare Other | Admitting: Cardiology

## 2020-05-31 ENCOUNTER — Other Ambulatory Visit: Payer: Self-pay

## 2020-05-31 VITALS — BP 138/78 | HR 72 | Ht 63.0 in | Wt 141.8 lb

## 2020-05-31 DIAGNOSIS — E782 Mixed hyperlipidemia: Secondary | ICD-10-CM | POA: Diagnosis not present

## 2020-05-31 DIAGNOSIS — R011 Cardiac murmur, unspecified: Secondary | ICD-10-CM

## 2020-05-31 DIAGNOSIS — I700 Atherosclerosis of aorta: Secondary | ICD-10-CM | POA: Diagnosis not present

## 2020-05-31 DIAGNOSIS — R9431 Abnormal electrocardiogram [ECG] [EKG]: Secondary | ICD-10-CM | POA: Diagnosis not present

## 2020-05-31 MED ORDER — ROSUVASTATIN CALCIUM 10 MG PO TABS: 10.0000 mg | ORAL_TABLET | Freq: Every day | ORAL | 3 refills | Status: AC

## 2020-05-31 NOTE — Patient Instructions (Addendum)
Medication Instructions:  Your physician has recommended you make the following change in your medication:   Start Crestor 10 mg daily.  *If you need a refill on your cardiac medications before your next appointment, please call your pharmacy*   Lab Work: Your physician recommends that you return for lab work in: 6 weeks (07/12/20).  You need to have labs done when you are fasting.  You can come Monday through Friday 8:30 am to 12:00 pm and 1:15 to 4:30. You do not need to make an appointment as the order has already been placed. The labs you are going to have done are BMET LFT and Lipids.  If you have labs (blood work) drawn today and your tests are completely normal, you will receive your results only by: Marland Kitchen MyChart Message (if you have MyChart) OR . A paper copy in the mail If you have any lab test that is abnormal or we need to change your treatment, we will call you to review the results.   Testing/Procedures: Your physician has requested that you have an echocardiogram. Echocardiography is a painless test that uses sound waves to create images of your heart. It provides your doctor with information about the size and shape of your heart and how well your heart's chambers and valves are working. This procedure takes approximately one hour. There are no restrictions for this procedure.     Follow-Up: At [facility], you and your health needs are our priority.  As part of our continuing mission to provide you with exceptional heart care, we have created designated Provider Care Teams.  These Care Teams include your primary Cardiologist (physician) and Advanced Practice Providers (APPs -  Physician Assistants and Nurse Practitioners) who all work together to provide you with the care you need, when you need it.  We recommend signing up for the patient portal called "MyChart".  Sign up information is provided on this After Visit Summary.  MyChart is used to connect with patients for  Virtual Visits (Telemedicine).  Patients are able to view lab/test results, encounter notes, upcoming appointments, etc.  Non-urgent messages can be sent to your provider as well.   To learn more about what you can do with MyChart, go to [email or web address].    Your next appointment:   3 months  The format for your next appointment:   In Person  Provider:   Belva Crome, MD   Other Instructions  Echocardiogram An echocardiogram is a test that uses sound waves (ultrasound) to produce images of the heart. Images from an echocardiogram can provide important information about:  Heart size and shape.  The size and thickness and movement of your heart's walls.  Heart muscle function and strength.  Heart valve function or if you have stenosis. Stenosis is when the heart valves are too narrow.  If blood is flowing backward through the heart valves (regurgitation).  A tumor or infectious growth around the heart valves.  Areas of heart muscle that are not working well because of poor blood flow or injury from a heart attack.  Aneurysm detection. An aneurysm is a weak or damaged part of an artery wall. The wall bulges out from the normal force of blood pumping through the body. Tell a health care provider about:  Any allergies you have.  All medicines you are taking, including vitamins, herbs, eye drops, creams, and over-the-counter medicines.  Any blood disorders you have.  Any surgeries you have had.  Any medical conditions you have.  Whether you are pregnant or may be pregnant. What are the risks? Generally, this is a safe test. However, problems may occur, including an allergic reaction to dye (contrast) that may be used during the test. What happens before the test? No specific preparation is needed. You may eat and drink normally. What happens during the test?  You will take off your clothes from the waist up and put on a hospital gown.  Electrodes or  electrocardiogram (ECG)patches may be placed on your chest. The electrodes or patches are then connected to a device that monitors your heart rate and rhythm.  You will lie down on a table for an ultrasound exam. A gel will be applied to your chest to help sound waves pass through your skin.  A handheld device, called a transducer, will be pressed against your chest and moved over your heart. The transducer produces sound waves that travel to your heart and bounce back (or "echo" back) to the transducer. These sound waves will be captured in real-time and changed into images of your heart that can be viewed on a video monitor. The images will be recorded on a computer and reviewed by your health care provider.  You may be asked to change positions or hold your breath for a short time. This makes it easier to get different views or better views of your heart.  In some cases, you may receive contrast through an IV in one of your veins. This can improve the quality of the pictures from your heart. The procedure may vary among health care providers and hospitals.   What can I expect after the test? You may return to your normal, everyday life, including diet, activities, and medicines, unless your health care provider tells you not to do that. Follow these instructions at home:  It is up to you to get the results of your test. Ask your health care provider, or the department that is doing the test, when your results will be ready.  Keep all follow-up visits. This is important. Summary  An echocardiogram is a test that uses sound waves (ultrasound) to produce images of the heart.  Images from an echocardiogram can provide important information about the size and shape of your heart, heart muscle function, heart valve function, and other possible heart problems.  You do not need to do anything to prepare before this test. You may eat and drink normally.  After the echocardiogram is completed, you  may return to your normal, everyday life, unless your health care provider tells you not to do that. This information is not intended to replace advice given to you by your health care provider. Make sure you discuss any questions you have with your health care provider. Document Revised: 12/20/2019 Document Reviewed: 12/20/2019 Elsevier Patient Education  2021 Elsevier Inc.  Rosuvastatin Tablets What is this medicine? ROSUVASTATIN (roe SOO va sta tin) is known as a HMG-CoA reductase inhibitor or 'statin'. It lowers cholesterol and triglycerides in the blood. This drug may also reduce the risk of heart attack, stroke, or other health problems in patients with risk factors for heart disease. Diet and lifestyle changes are often used with this drug. This medicine may be used for other purposes; ask your health care provider or pharmacist if you have questions. COMMON BRAND NAME(S): Crestor What should I tell my health care provider before I take this medicine? They need to know if you have any of these conditions:  diabetes  if you often  drink alcohol  history of stroke  kidney disease  liver disease  muscle aches or weakness  thyroid disease  an unusual or allergic reaction to rosuvastatin, other medicines, foods, dyes, or preservatives  pregnant or trying to get pregnant  breast-feeding How should I use this medicine? Take this medicine by mouth with a glass of water. Follow the directions on the prescription label. Do not cut, crush or chew this medicine. You can take this medicine with or without food. Take your doses at regular intervals. Do not take your medicine more often than directed. Talk to your pediatrician regarding the use of this medicine in children. While this drug may be prescribed for children as young as 48 years old for selected conditions, precautions do apply. Overdosage: If you think you have taken too much of this medicine contact a poison control center or  emergency room at once. NOTE: This medicine is only for you. Do not share this medicine with others. What if I miss a dose? If you miss a dose, take it as soon as you can. If your next dose is to be taken in less than 12 hours, then do not take the missed dose. Take the next dose at your regular time. Do not take double or extra doses. What may interact with this medicine? Do not take this medicine with any of the following medications:  herbal medicines like red yeast rice This medicine may also interact with the following medications:  alcohol  antacids containing aluminum hydroxide or magnesium hydroxide  cyclosporine  other medicines for high cholesterol  some medicines for HIV infection  warfarin This list may not describe all possible interactions. Give your health care provider a list of all the medicines, herbs, non-prescription drugs, or dietary supplements you use. Also tell them if you smoke, drink alcohol, or use illegal drugs. Some items may interact with your medicine. What should I watch for while using this medicine? Visit your doctor or health care professional for regular check-ups. You may need regular tests to make sure your liver is working properly. Your health care professional may tell you to stop taking this medicine if you develop muscle problems. If your muscle problems do not go away after stopping this medicine, contact your health care professional. Do not become pregnant while taking this medicine. Women should inform their health care professional if they wish to become pregnant or think they might be pregnant. There is a potential for serious side effects to an unborn child. Talk to your health care professional or pharmacist for more information. Do not breast-feed an infant while taking this medicine. This medicine may increase blood sugar. Ask your healthcare provider if changes in diet or medicines are needed if you have diabetes. If you are going to  need surgery or other procedure, tell your doctor that you are using this medicine. This drug is only part of a total heart-health program. Your doctor or a dietician can suggest a low-cholesterol and low-fat diet to help. Avoid alcohol and smoking, and keep a proper exercise schedule. This medicine may cause a decrease in Co-Enzyme Q-10. You should make sure that you get enough Co-Enzyme Q-10 while you are taking this medicine. Discuss the foods you eat and the vitamins you take with your health care professional. What side effects may I notice from receiving this medicine? Side effects that you should report to your doctor or health care professional as soon as possible:  allergic reactions like skin rash, itching  or hives, swelling of the face, lips, or tongue  confusion  joint pain  loss of memory  redness, blistering, peeling or loosening of the skin, including inside the mouth  signs and symptoms of high blood sugar such as being more thirsty or hungry or having to urinate more than normal. You may also feel very tired or have blurry vision.  signs and symptoms of muscle injury like dark urine; trouble passing urine or change in the amount of urine; unusually weak or tired; muscle pain or side or back pain  yellowing of the eyes or skin Side effects that usually do not require medical attention (report to your doctor or health care professional if they continue or are bothersome):  constipation  diarrhea  dizziness  gas  headache  nausea  stomach pain  trouble sleeping  upset stomach This list may not describe all possible side effects. Call your doctor for medical advice about side effects. You may report side effects to FDA at 1-800-FDA-1088. Where should I keep my medicine? Keep out of the reach of children and pets. Store between 20 and 25 degrees C (68 and 77 degrees F). Get rid of any unused medicine after the expiration date. To get rid of medicines that are  no longer needed or have expired:  Take the medicine to a medicine take-back program. Check with your pharmacy or law enforcement to find a location.  If you cannot return the medicine, check the label or package insert to see if the medicine should be thrown out in the garbage or flushed down the toilet. If you are not sure, ask your health care provider. If it is safe to put it in the trash, take the medicine out of the container. Mix the medicine with cat litter, dirt, coffee grounds, or other unwanted substance. Seal the mixture in a bag or container. Put it in the trash. NOTE: This sheet is a summary. It may not cover all possible information. If you have questions about this medicine, talk to your doctor, pharmacist, or health care provider.  2021 Elsevier/Gold Standard (2020-03-11 09:55:07)

## 2020-05-31 NOTE — Progress Notes (Signed)
Cardiology Office Note:    Date:  05/31/2020   ID:  Ashley Harper, DOB 04/08/1956, MRN [id number]  PCP:  Philemon Kingdom, MD  Cardiologist:  Garwin Brothers, MD   Referring MD: Philemon Kingdom, MD    ASSESSMENT:    1. Aortic atherosclerosis (HCC)   2. Murmur   3. Cardiac murmur   4. Abnormal electrocardiogram (ECG) (EKG)   5. Mixed hyperlipidemia    PLAN:    In order of problems listed above:  1. Primary prevention stressed with the patient.  Importance of compliance with diet medication stressed and she vocalized understanding. 2. Aortic atherosclerosis: I discussed this finding with her at length.  Please see my discussion about dyslipidemia 3. Mixed dyslipidemia: Records from primary care physician evaluated.  Labs reviewed.  She has significant elevation in LDL.  In view of aortic atherosclerosis I recommended statin therapy and she is agreeable.  I will initiate her on rosuvastatin 10 mg daily.  Benefits and potential is explained to the patient and she vocalized understanding.  She will be back in 6 weeks for liver lipid check.  Diet was emphasized.  Advised her to walk at least half an hour a day 5 days a week and she promises to do so. 4. Cardiac murmur: Echocardiogram will be done to assess murmur heard on auscultation 5. Patient will be seen in follow-up appointment in 2 months or earlier if the patient has any concerns    Medication Adjustments/Labs and Tests Ordered: Current medicines are reviewed at length with the patient today.  Concerns regarding medicines are outlined above.  Orders Placed This Encounter  Procedures  . Basic metabolic panel  . Hepatic function panel  . Lipid panel  . EKG 12-Lead  . ECHOCARDIOGRAM COMPLETE   Meds ordered this encounter  Medications  . rosuvastatin (CRESTOR) 10 MG tablet    Sig: Take 1 tablet (10 mg total) by mouth daily.    Dispense:  90 tablet    Refill:  3     History of Present Illness:    Ashley Harper  is a 65 y.o. female who is being seen today for the evaluation of abnormal EKG at the request of Philemon Kingdom, MD.  Patient is a pleasant 65 year old female.  She has past medical history of aortic atherosclerosis found on CT scan report from a year ago.  She has history of mixed dyslipidemia.  She leads a sedentary lifestyle.  She denies any chest pain orthopnea or PND.  At the time of my evaluation, the patient is alert awake oriented and in no distress.  She mentions to me that she is concerned about her EKG and here for evaluation.  Again I asked her repeatedly whether she had any chest pain or shortness of breath on exertion and she denied it.  Her husband accompanies her for this visit.  Past Medical History:  Diagnosis Date  . Abnormal EKG   . Anxiety   . DDD (degenerative disc disease), cervical 01/07/2017  . Gallstones   . Hyperlipidemia   . Migraine   . Overactive bladder 01/07/2017  . Overweight 11/15/2015  . Postcholecystectomy diarrhea   . Seasonal allergies   . Shingles   . Umbilical hernia   . Umbilical hernia without obstruction and without gangrene 01/07/2017  . Vitamin D deficiency 11/15/2015    Past Surgical History:  Procedure Laterality Date  . CATARACT EXTRACTION Left   . CHOLECYSTECTOMY    . HERNIA REPAIR  Current Medications: Current Meds  Medication Sig  . cholestyramine (QUESTRAN) 4 GM/DOSE powder Take by mouth. 1/4 scoop oral daily  . rosuvastatin (CRESTOR) 10 MG tablet Take 1 tablet (10 mg total) by mouth daily.  . SUMAtriptan (IMITREX) 100 MG tablet Take 100 mg by mouth as needed.     Allergies:   Azithromycin   Social History   Socioeconomic History  . Marital status: Married    Spouse name: Not on file  . Number of children: Not on file  . Years of education: Not on file  . Highest education level: Not on file  Occupational History  . Not on file  Tobacco Use  . Smoking status: Never Smoker  . Smokeless tobacco: Never Used  Substance  and Sexual Activity  . Alcohol use: Not on file  . Drug use: Not on file  . Sexual activity: Not on file  Other Topics Concern  . Not on file  Social History Narrative  . Not on file   Social Determinants of Health   Financial Resource Strain: Not on file  Food Insecurity: Not on file  Transportation Needs: Not on file  Physical Activity: Not on file  Stress: Not on file  Social Connections: Not on file     Family History: The patient's family history includes Cancer - Cervical in her father; Dementia in her mother; Lung cancer in her brother.  ROS:   Please see the history of present illness.    All other systems reviewed and are negative.  EKGs/Labs/Other Studies Reviewed:    The following studies were reviewed today: EKG reveals sinus rhythm left axis deviation and nonspecific ST-T changes   Recent Labs: No results found for requested labs within last 8760 hours.  Recent Lipid Panel No results found for: CHOL, TRIG, HDL, CHOLHDL, VLDL, LDLCALC, LDLDIRECT  Physical Exam:    VS:  BP 138/78   Pulse 72   Ht 5\' 3"  (1.6 m)   Wt 141 lb 12.8 oz (64.3 kg)   SpO2 98%   BMI 25.12 kg/m     Wt Readings from Last 3 Encounters:  05/31/20 141 lb 12.8 oz (64.3 kg)     GEN: Patient is in no acute distress HEENT: Normal NECK: No JVD; No carotid bruits LYMPHATICS: No lymphadenopathy CARDIAC: S1 S2 regular, 2/6 systolic murmur at the apex. RESPIRATORY:  Clear to auscultation without rales, wheezing or rhonchi  ABDOMEN: Soft, non-tender, non-distended MUSCULOSKELETAL:  No edema; No deformity  SKIN: Warm and dry NEUROLOGIC:  Alert and oriented x 3 PSYCHIATRIC:  Normal affect    Signed, 06/02/20, MD  05/31/2020 9:50 AM    [facility]

## 2020-06-01 ENCOUNTER — Ambulatory Visit: Payer: BLUE CROSS/BLUE SHIELD | Admitting: Cardiology

## 2020-06-26 ENCOUNTER — Ambulatory Visit (INDEPENDENT_AMBULATORY_CARE_PROVIDER_SITE_OTHER): Payer: Medicare Other

## 2020-06-26 ENCOUNTER — Other Ambulatory Visit: Payer: Self-pay

## 2020-06-26 DIAGNOSIS — I700 Atherosclerosis of aorta: Secondary | ICD-10-CM | POA: Diagnosis not present

## 2020-06-26 DIAGNOSIS — R011 Cardiac murmur, unspecified: Secondary | ICD-10-CM | POA: Diagnosis not present

## 2020-06-26 LAB — ECHOCARDIOGRAM COMPLETE
Area-P 1/2: 3.72 cm2
Calc EF: 54.1 %
S' Lateral: 2.1 cm
Single Plane A2C EF: 53.1 %
Single Plane A4C EF: 53.2 %

## 2020-06-26 NOTE — Progress Notes (Signed)
Complete echocardiogram performed.  Jimmy Vernella Niznik RDCS, RVT  

## 2020-09-04 ENCOUNTER — Ambulatory Visit: Payer: BC Managed Care – PPO | Admitting: Cardiology

## 2020-09-11 ENCOUNTER — Other Ambulatory Visit: Payer: Self-pay | Admitting: Gastroenterology

## 2020-09-11 DIAGNOSIS — Z1211 Encounter for screening for malignant neoplasm of colon: Secondary | ICD-10-CM

## 2020-10-15 ENCOUNTER — Ambulatory Visit
Admission: RE | Admit: 2020-10-15 | Discharge: 2020-10-15 | Disposition: A | Discharge status: Home / Self Care | Payer: Medicare Other | Source: Ambulatory Visit | Attending: Gastroenterology | Admitting: Gastroenterology

## 2020-10-15 DIAGNOSIS — Z1211 Encounter for screening for malignant neoplasm of colon: Secondary | ICD-10-CM

## 2021-06-13 IMAGING — CT CT VIRTUAL COLONOSCOPY SCREENING
2 of 9 series · 12 of 46 positions shown, 18 images · non-contrast
Comparison: 07/09/2018

CLINICAL DATA: Screening

EXAM:
CT VIRTUAL COLONOSCOPY SCREENING
TECHNIQUE: The patient was given a standard bowel preparation with Gastrografin
and barium for fluid and stool tagging respectively. The quality of
the bowel preparation is moderate with moderate amount of retained
layering barium. Automated CO2 insufflation of the colon was
performed prior to image acquisition and colonic distention is good.
Image post processing was used to generate a 3D endoluminal
fly-through projection of the colon and to electronically subtract
stool/fluid as appropriate.

[Series 3: supine colon 1.50 br40 s3 supine thins · axial · 0.74mm/px · z∈[+1239,+1620]mm · 9 of 318 slices shown, 15 images]
[im 32/318  soft-tissue]
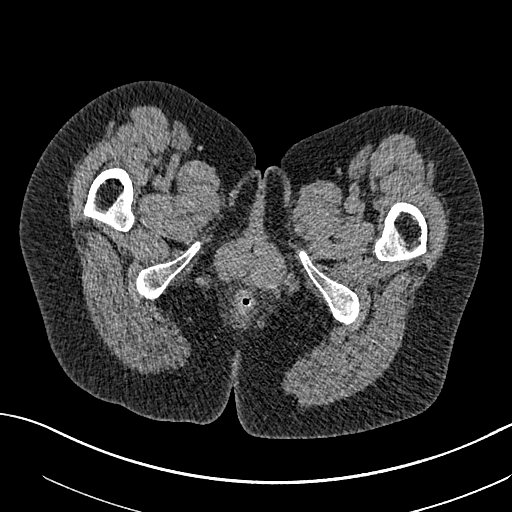
[im 32/318  bone]
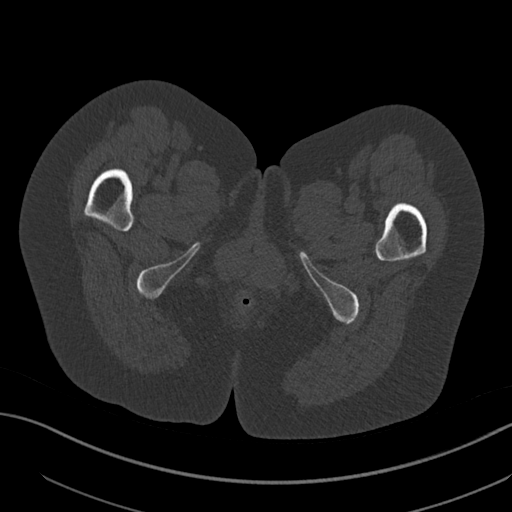
[im 64/318  soft-tissue]
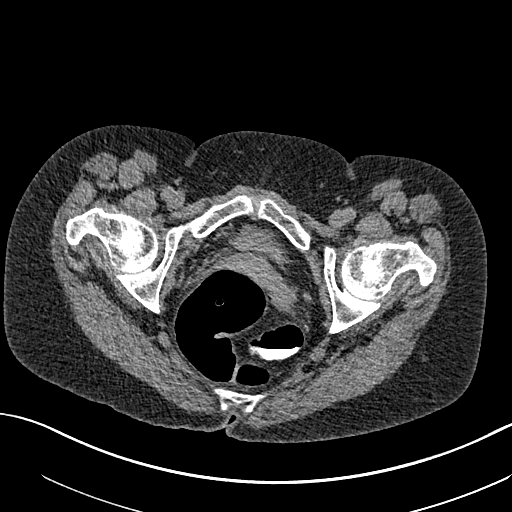
[im 96/318  soft-tissue]
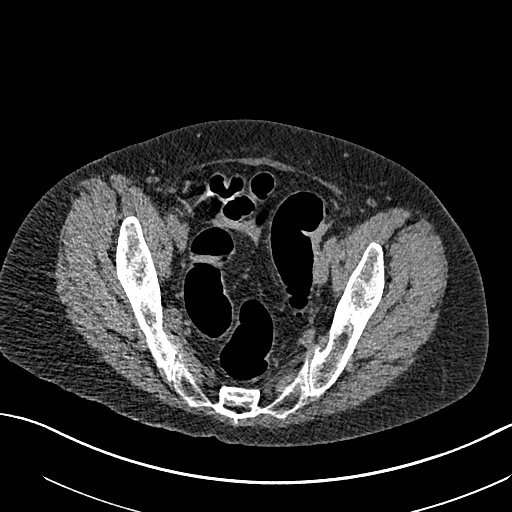
[im 127/318  soft-tissue]
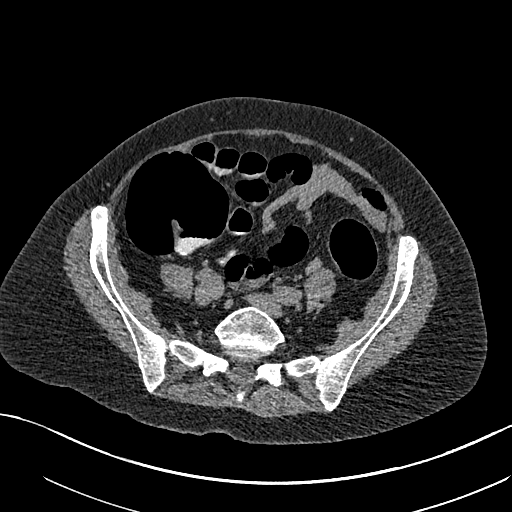
[im 159/318  soft-tissue]
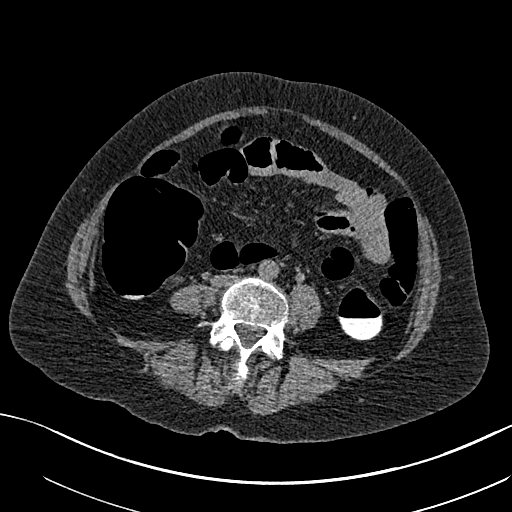
[im 191/318  soft-tissue]
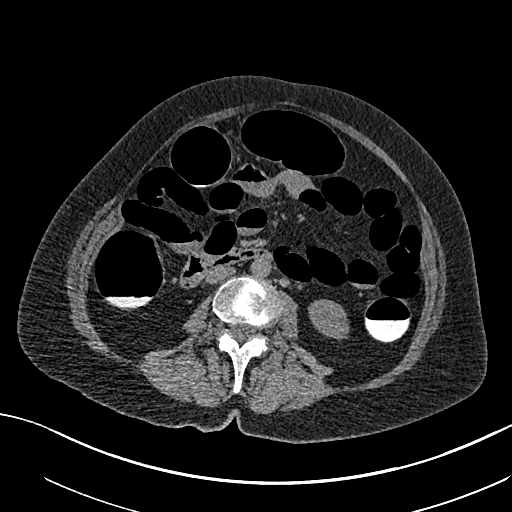
[im 191/318  lung]
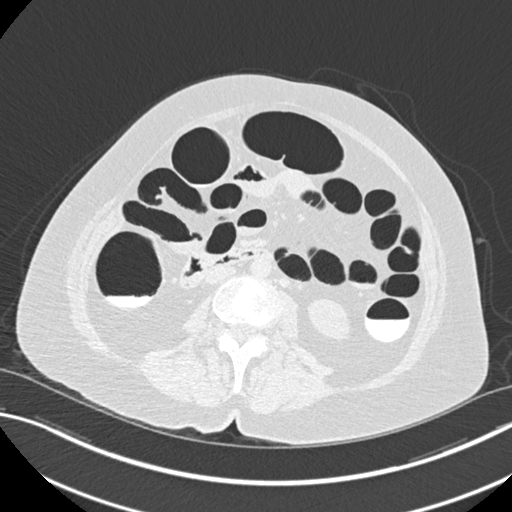
[im 222/318  soft-tissue]
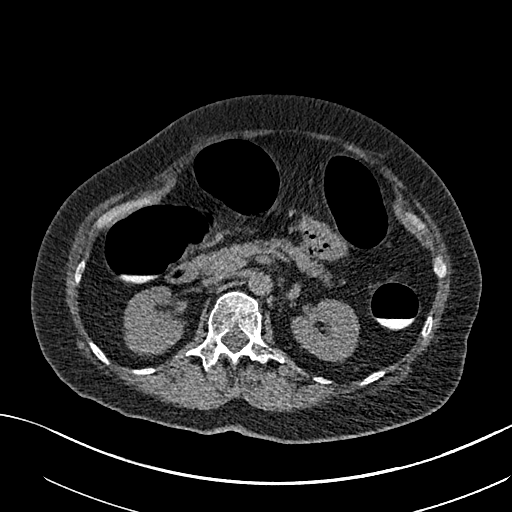
[im 222/318  lung]
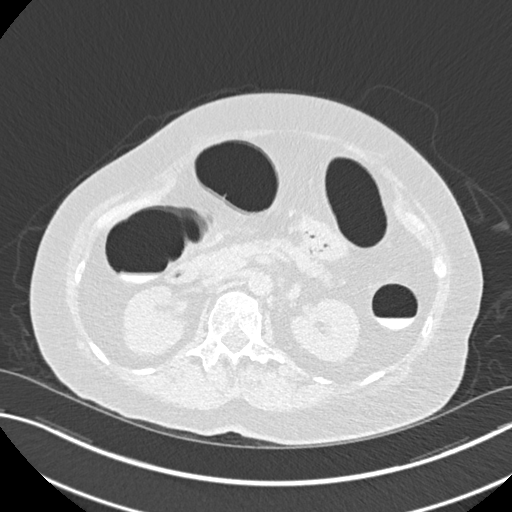
[im 254/318  soft-tissue]
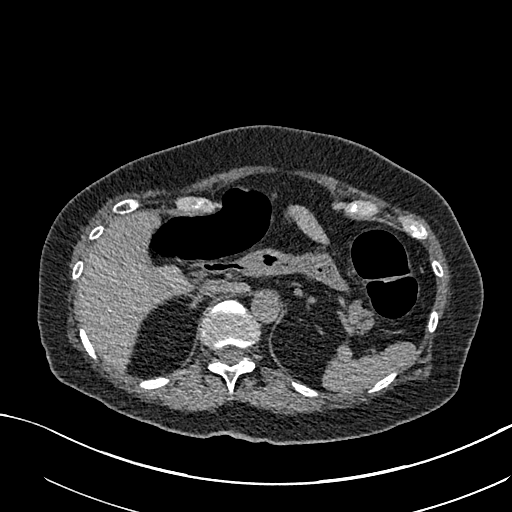
[im 254/318  lung]
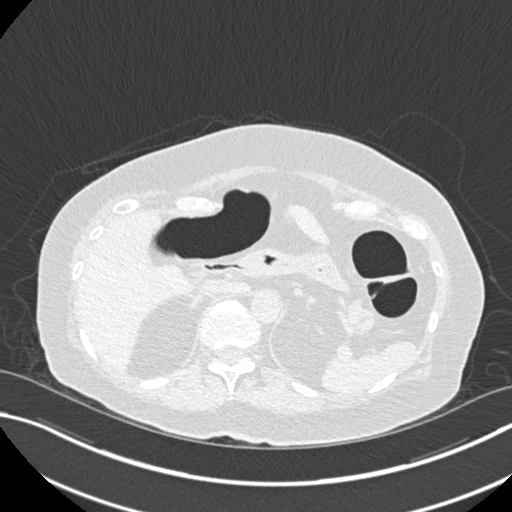
[im 286/318  soft-tissue]
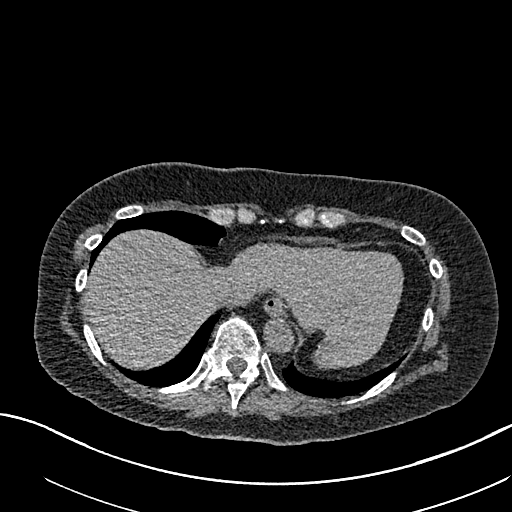
[im 286/318  lung]
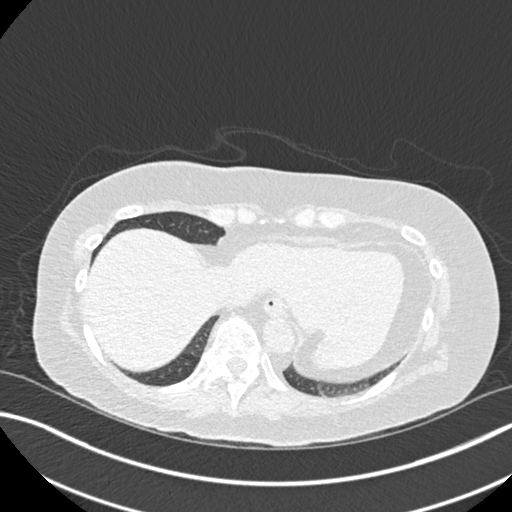
[im 286/318  bone]
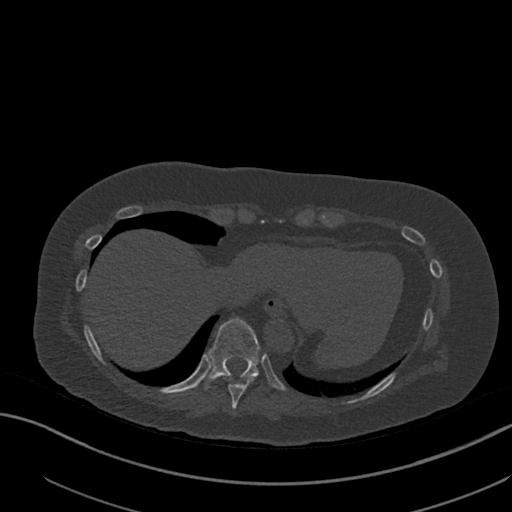

[Series 5: supine colon 3.00 br40 s3 cor supine · coronal · 0.74mm/px · 3 of 126 slices shown]
[im 32/126  soft-tissue]
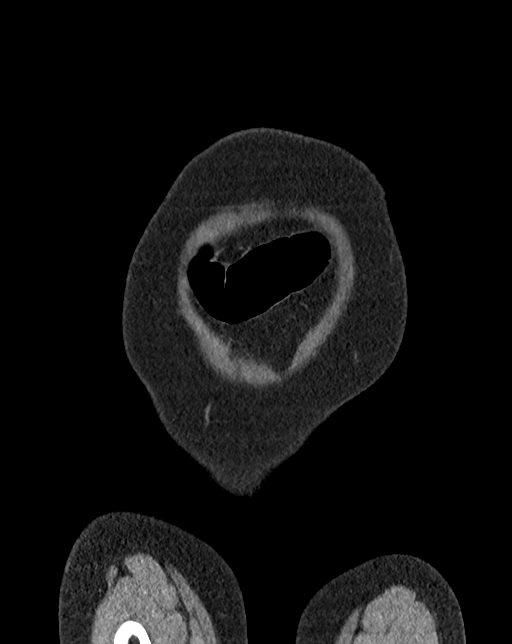
[im 63/126  soft-tissue]
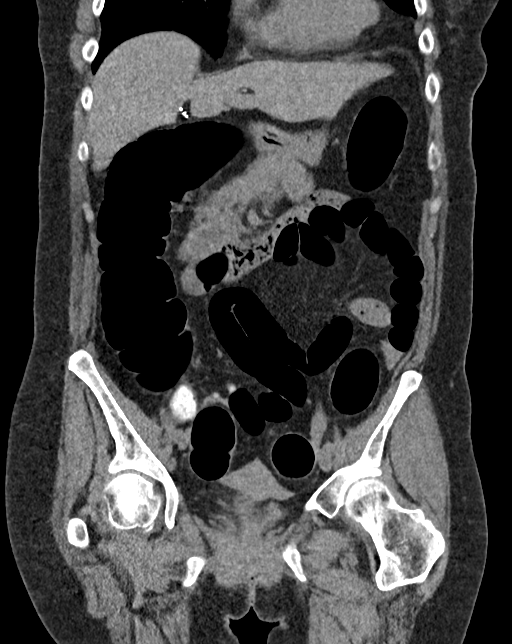
[im 94/126  soft-tissue]
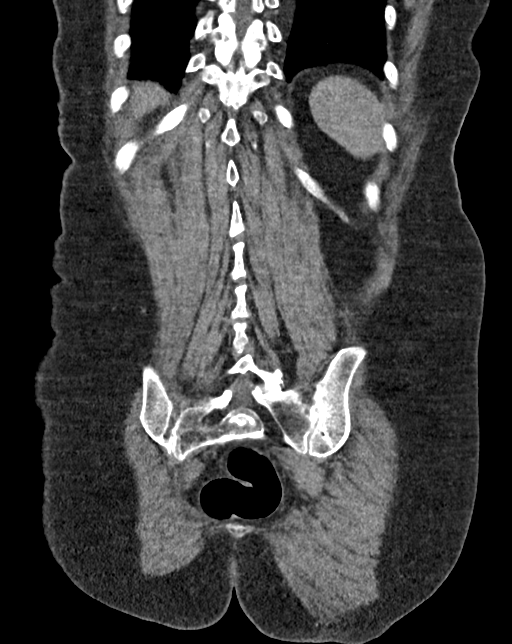

[12 of 46 positions shown; findings below may reference images not displayed]

FINDINGS: VIRTUAL COLONOSCOPY

Moderate retained layering barium throughout the colon. No fixed
polypoid filling defects or annular constricting lesions.

Virtual colonoscopy is not designed to detect diminutive polyps
(i.e., less than or equal to 5 mm), the presence or absence of which
may not affect clinical management.

CT ABDOMEN AND PELVIS WITHOUT CONTRAST

Lower chest: No acute abnormality

Hepatobiliary: Prior cholecystectomy.  No focal hepatic abnormality.

Pancreas: No focal abnormality or ductal dilatation.

Spleen: No focal abnormality.  Normal size.

Adrenals/Urinary Tract: No adrenal abnormality. No focal renal
abnormality. No stones or hydronephrosis. Urinary bladder is
unremarkable.

Stomach/Bowel: Stomach and small bowel decompressed, grossly
unremarkable.

Vascular/Lymphatic: Scattered aortic atherosclerosis. No evidence of
aneurysm or adenopathy.

Reproductive: Uterus and adnexa unremarkable.  No mass.

Other: No free fluid or free air.

Musculoskeletal: No acute bony abnormality.
IMPRESSION: No suspicious non mobile polypoid filling defect or annular
constricting lesion.

Scattered aortic atherosclerosis.

No acute extra colonic abnormality.

## 2023-11-02 ENCOUNTER — Encounter (HOSPITAL_BASED_OUTPATIENT_CLINIC_OR_DEPARTMENT_OTHER): Payer: Self-pay | Admitting: Emergency Medicine

## 2023-11-02 ENCOUNTER — Ambulatory Visit (HOSPITAL_BASED_OUTPATIENT_CLINIC_OR_DEPARTMENT_OTHER): Admission: EM | Admit: 2023-11-02 | Discharge: 2023-11-02 | Disposition: A | Discharge status: Home / Self Care

## 2023-11-02 DIAGNOSIS — H8113 Benign paroxysmal vertigo, bilateral: Secondary | ICD-10-CM | POA: Diagnosis not present

## 2023-11-02 DIAGNOSIS — J0140 Acute pansinusitis, unspecified: Secondary | ICD-10-CM | POA: Diagnosis not present

## 2023-11-02 DIAGNOSIS — R112 Nausea with vomiting, unspecified: Secondary | ICD-10-CM

## 2023-11-02 MED ORDER — MECLIZINE HCL 25 MG PO TABS: 25.0000 mg | ORAL_TABLET | Freq: Three times a day (TID) | ORAL | 0 refills | Status: AC | PRN

## 2023-11-02 MED ORDER — "SYRINGE/NEEDLE (DISP) 23G X 1-1/2"" 3 ML MISC": 1.0000 | Freq: Three times a day (TID) | 0 refills | Status: AC | PRN

## 2023-11-02 MED ORDER — ONDANSETRON HCL 4 MG/2ML IJ SOLN: 4.0000 mg | Freq: Three times a day (TID) | INTRAMUSCULAR | 0 refills | Status: AC | PRN

## 2023-11-02 MED ORDER — ERYTHROMYCIN BASE 250 MG PO TABS: 250.0000 mg | ORAL_TABLET | Freq: Three times a day (TID) | ORAL | 0 refills | Status: DC

## 2023-11-02 MED ORDER — AMOXICILLIN-POT CLAVULANATE 875-125 MG PO TABS: 1.0000 | ORAL_TABLET | Freq: Two times a day (BID) | ORAL | 0 refills | Status: AC

## 2023-11-02 MED ORDER — PREDNISONE 20 MG PO TABS: 20.0000 mg | ORAL_TABLET | Freq: Every day | ORAL | 0 refills | Status: AC

## 2023-11-02 MED ORDER — METOCLOPRAMIDE HCL 10 MG PO TABS: 10.0000 mg | ORAL_TABLET | Freq: Two times a day (BID) | ORAL | 0 refills | Status: AC | PRN

## 2023-11-02 NOTE — Discharge Instructions (Addendum)
 BPPV: May benefit from seeing ENT and learning exercises to help prevent BPPV.  Renewed meclizine and metoclopramide  take as previously instructed.  Nausea and vomiting: Instructed personally on how to inject an intramuscular injection.  Ondansetron  4 mg injection every 8 hours if needed for nausea and vomiting.  This can be used with the vertigo or with migraines.  Do not take the ondansetron  and the moclobemide at the same time.  Sinusitis: Prednisone 20 mg daily x 5 days.  Erythromycin 250 mg 1 pill 3 times daily for 7 days.  Get plenty of fluids and rest.  Follow-up with primary care or may need to see ENT if symptoms persist.  Follow-up here if symptoms do not resolve, worsen or if new symptoms occur.

## 2023-11-02 NOTE — ED Provider Notes (Addendum)
 [facility]    CSN: [id number] Arrival date & time: 11/02/23  9070      History   Chief Complaint No chief complaint on file.   HPI Ashley Harper is a 68 y.o. female.   Patient reports on 10/31/2023, she woke up with projectile vomiting and vertigo when she turned her head first thing in the morning.  She vomited off-and-on for 15 or 20 minutes.  She developed a migraine that she associated with the vertigo and the vomiting.  Her husband gave her injection of sumatriptan hand.  And she took the clopamide pill for the vomiting.  She has had symptoms off and on since 10/31/2023.  If she is upright and holds her head still she feels good but otherwise she has nasal discharge, sinus pressure and pain, frontal headache and intermittent dizziness and nausea.     Past Medical History:  Diagnosis Date   Abnormal EKG    Anxiety    DDD (degenerative disc disease), cervical 01/07/2017   Gallstones    Hyperlipidemia    Migraine    Overactive bladder 01/07/2017   Overweight 11/15/2015   Postcholecystectomy diarrhea    Seasonal allergies    Shingles    Umbilical hernia    Umbilical hernia without obstruction and without gangrene 01/07/2017   Vitamin D deficiency 11/15/2015    Patient Active Problem List   Diagnosis Date Noted   Aortic atherosclerosis (HCC) 05/31/2020   Cardiac murmur 05/31/2020   Abnormal electrocardiogram (ECG) (EKG) 05/31/2020   Abnormal EKG    Anxiety    Gallstones    Postcholecystectomy diarrhea    Seasonal allergies    Shingles    Migraine 01/07/2017   Umbilical hernia without obstruction and without gangrene 01/07/2017   DDD (degenerative disc disease), cervical 01/07/2017   Overactive bladder 01/07/2017   Hyperlipidemia 11/15/2015   Overweight 11/15/2015   Vitamin D deficiency 11/15/2015    Past Surgical History:  Procedure Laterality Date   CATARACT EXTRACTION Left    CHOLECYSTECTOMY     HERNIA REPAIR      OB History   No obstetric  history on file.      Home Medications    Prior to Admission medications   Medication Sig Start Date End Date Taking? Authorizing Provider  amoxicillin-clavulanate (AUGMENTIN) 875-125 MG tablet Take 1 tablet by mouth 2 (two) times daily after a meal for 7 days. 11/02/23 11/09/23 Yes Ival Domino, FNP  ondansetron  (ZOFRAN ) 4 MG/2ML SOLN injection Inject 2 mLs (4 mg total) into the muscle every 8 (eight) hours as needed for nausea or vomiting. 11/02/23  Yes Ival Domino, FNP  predniSONE (DELTASONE) 20 MG tablet Take 1 tablet (20 mg total) by mouth daily with breakfast for 5 days. 11/02/23 11/07/23 Yes Ival Domino, FNP  SUMAtriptan (IMITREX) 100 MG tablet Take 100 mg by mouth as needed.   Yes [provider]  SYRINGE-NEEDLE, DISP, 3 ML 23G X 1-1/2 3 ML MISC 1 each by Does not apply route every 8 (eight) hours as needed (Nausea/Vomiting - use for Ondansetron  injection). 11/02/23  Yes Ival Domino, FNP  cholestyramine (QUESTRAN) 4 GM/DOSE powder Take by mouth. 1/4 scoop oral daily 01/27/20   [provider]  meclizine (ANTIVERT) 25 MG tablet Take 1 tablet (25 mg total) by mouth 3 (three) times daily as needed for dizziness. 11/02/23   Ival Domino, FNP  metoCLOPramide  (REGLAN ) 10 MG tablet Take 1 tablet (10 mg total) by mouth 2 (two) times daily as needed for  nausea or vomiting. 11/02/23   Ival Domino, FNP  rosuvastatin  (CRESTOR ) 10 MG tablet Take 1 tablet (10 mg total) by mouth daily. 05/31/20 08/29/20  Revankar, Jennifer SAUNDERS, MD    Family History Family History  Problem Relation Age of Onset   Dementia Mother    Cancer - Cervical Father    Lung cancer Brother     Social History Social History   Tobacco Use   Smoking status: Never   Smokeless tobacco: Never     Allergies   Azithromycin   Review of Systems Review of Systems  Constitutional:  Negative for chills and fever.  HENT:  Positive for congestion, postnasal drip, rhinorrhea, sinus pressure and sinus pain.  Negative for ear pain and sore throat.   Eyes:  Negative for pain and visual disturbance.  Respiratory:  Negative for cough and shortness of breath.   Cardiovascular:  Negative for chest pain and palpitations.  Gastrointestinal:  Positive for nausea and vomiting. Negative for abdominal pain, constipation and diarrhea.  Genitourinary:  Negative for dysuria and hematuria.  Musculoskeletal:  Negative for arthralgias and back pain.  Skin:  Negative for color change and rash.  Neurological:  Positive for dizziness, light-headedness and headaches. Negative for seizures and syncope.  All other systems reviewed and are negative.    Physical Exam Triage Vital Signs ED Triage Vitals  Encounter Vitals Group     BP 11/02/23 1059 (!) 138/91     Girls Systolic BP Percentile --      Girls Diastolic BP Percentile --      Boys Systolic BP Percentile --      Boys Diastolic BP Percentile --      Pulse Rate 11/02/23 1059 76     Resp 11/02/23 1059 18     Temp 11/02/23 1059 97.8 F (36.6 C)     Temp Source 11/02/23 1059 Oral     SpO2 11/02/23 1059 97 %     Weight --      Height --      Head Circumference --      Peak Flow --      Pain Score 11/02/23 1057 0     Pain Loc --      Pain Education --      Exclude from Growth Chart --    No data found.  Updated Vital Signs BP (!) 138/91 (BP Location: Right Arm)   Pulse 76   Temp 97.8 F (36.6 C) (Oral)   Resp 18   SpO2 97%   Visual Acuity Right Eye Distance:   Left Eye Distance:   Bilateral Distance:    Right Eye Near:   Left Eye Near:    Bilateral Near:     Physical Exam Vitals and nursing note reviewed.  Constitutional:      General: She is not in acute distress.    Appearance: She is well-developed. She is not ill-appearing or toxic-appearing.  HENT:     Head: Normocephalic and atraumatic.     Right Ear: Hearing, tympanic membrane, ear canal and external ear normal.     Left Ear: Hearing, tympanic membrane, ear canal and  external ear normal.     Nose: Congestion and rhinorrhea present.     Right Sinus: Maxillary sinus tenderness and frontal sinus tenderness present.     Left Sinus: Maxillary sinus tenderness and frontal sinus tenderness present.     Mouth/Throat:     Lips: Pink.     Mouth: Mucous membranes are  moist.     Pharynx: Uvula midline. No oropharyngeal exudate or posterior oropharyngeal erythema.     Tonsils: No tonsillar exudate.   Eyes:     Conjunctiva/sclera: Conjunctivae normal.     Pupils: Pupils are equal, round, and reactive to light.    Cardiovascular:     Rate and Rhythm: Normal rate and regular rhythm.     Heart sounds: S1 normal and S2 normal. No murmur heard. Pulmonary:     Effort: Pulmonary effort is normal. No respiratory distress.     Breath sounds: Normal breath sounds. No decreased breath sounds, wheezing, rhonchi or rales.  Abdominal:     General: Bowel sounds are normal.     Palpations: Abdomen is soft.     Tenderness: There is generalized abdominal tenderness (mild).   Musculoskeletal:        General: No swelling.     Cervical back: Neck supple.  Lymphadenopathy:     Head:     Right side of head: No submental, submandibular, tonsillar, preauricular or posterior auricular adenopathy.     Left side of head: No submental, submandibular, tonsillar, preauricular or posterior auricular adenopathy.     Cervical: Cervical adenopathy present.     Right cervical: Superficial cervical adenopathy present.     Left cervical: Superficial cervical adenopathy present.   Skin:    General: Skin is warm and dry.     Capillary Refill: Capillary refill takes less than 2 seconds.     Findings: No rash.   Neurological:     Mental Status: She is alert and oriented to person, place, and time.   Psychiatric:        Mood and Affect: Mood normal.      UC Treatments / Results  Labs (all labs ordered are listed, but only abnormal results are displayed) Labs Reviewed - No data to  display  EKG   Radiology No results found.  Procedures Procedures (including critical care time)  Medications Ordered in UC Medications - No data to display  Initial Impression / Assessment and Plan / UC Course  I have reviewed the triage vital signs and the nursing notes.  Pertinent labs & imaging results that were available during my care of the patient were reviewed by me and considered in my medical decision making (see chart for details).  Plan of Care: BPPV: Renewed meclizine and metoclopramide .  Take medications as previously prescribed.  Nausea and vomiting associated with BPPV and migraines: Instructed the patient and her husband on intramuscular injection with handout given and demonstration given.  Ondansetron  4 mg injection every 8 hours if needed for nausea and vomiting.  Do not use ondansetron  and the metoclopramide  at the same time.  Sinusitis: Prednisone 20 mg daily x 5 days erythromycin 250 mg number 1 pill 3 times daily for 7 days.  Get plenty of fluids and rest.  Encourage sinus nasal rinses.  Encouraged to follow-up with primary care as she may need a referral to ENT regarding sinus infection or BPPV.  Follow-up here if symptoms do not improve, worsen or new symptoms occur.  After the patient left she called back and reported that she was confused.  She is allergic to azithromycin and does not tolerate erythromycin.  It was Augmentin that works well for sinus infections and she wants Augmentin sent to her pharmacy.  Erythromycin canceled.  Augmentin 875/125 mg, 1 pill twice daily for 7 days sent to the [facility] for the patient.  I reviewed the plan  of care with the patient and/or the patient's guardian.  The patient and/or guardian had time to ask questions and acknowledged that the questions were answered.  I provided instruction on symptoms or reasons to return here or to go to an ER, if symptoms/condition did not improve, worsened or if new symptoms occurred.  Final  Clinical Impressions(s) / UC Diagnoses   Final diagnoses:  Benign paroxysmal positional vertigo due to bilateral vestibular disorder  Nausea and vomiting, unspecified vomiting type  Acute non-recurrent pansinusitis     Discharge Instructions      BPPV: May benefit from seeing ENT and learning exercises to help prevent BPPV.  Renewed meclizine and metoclopramide  take as previously instructed.  Nausea and vomiting: Instructed personally on how to inject an intramuscular injection.  Ondansetron  4 mg injection every 8 hours if needed for nausea and vomiting.  This can be used with the vertigo or with migraines.  Do not take the ondansetron  and the moclobemide at the same time.  Sinusitis: Prednisone 20 mg daily x 5 days.  Erythromycin 250 mg 1 pill 3 times daily for 7 days.  Get plenty of fluids and rest.  Follow-up with primary care or may need to see ENT if symptoms persist.  Follow-up here if symptoms do not resolve, worsen or if new symptoms occur.     ED Prescriptions     Medication Sig Dispense Auth. Provider   meclizine (ANTIVERT) 25 MG tablet Take 1 tablet (25 mg total) by mouth 3 (three) times daily as needed for dizziness. 30 tablet Tayla Panozzo, FNP   metoCLOPramide  (REGLAN ) 10 MG tablet Take 1 tablet (10 mg total) by mouth 2 (two) times daily as needed for nausea or vomiting. 20 tablet Hanaan Gancarz, FNP   ondansetron  (ZOFRAN ) 4 MG/2ML SOLN injection Inject 2 mLs (4 mg total) into the muscle every 8 (eight) hours as needed for nausea or vomiting. 20 mL Ival Domino, FNP   SYRINGE-NEEDLE, DISP, 3 ML 23G X 1-1/2 3 ML MISC 1 each by Does not apply route every 8 (eight) hours as needed (Nausea/Vomiting - use for Ondansetron  injection). 10 each Ival Domino, FNP   predniSONE (DELTASONE) 20 MG tablet Take 1 tablet (20 mg total) by mouth daily with breakfast for 5 days. 5 tablet Travares Nelles, FNP   erythromycin (E-MYCIN) 250 MG tablet  (Status: Discontinued) Take 1 tablet (250 mg  total) by mouth 3 (three) times daily for 7 days. 21 tablet Sharada Albornoz, FNP   amoxicillin-clavulanate (AUGMENTIN) 875-125 MG tablet Take 1 tablet by mouth 2 (two) times daily after a meal for 7 days. 14 tablet Manson Luckadoo, FNP      PDMP not reviewed this encounter.   Ival Domino, FNP 11/02/23 1206    Ival Domino, FNP 11/02/23 [phone]

## 2023-11-02 NOTE — ED Triage Notes (Signed)
 Pt reports she has vertigo since Saturday she woke up vomiting and then it triggered a migraine also.
